# Patient Record
Sex: Male | Born: 1990 | Race: White | Hispanic: No | Marital: Single | Smoking: Current every day smoker
Health system: Southern US, Community
[De-identification: ages and names within clinical notes are randomized; demographics above are authoritative.]

## PROBLEM LIST (undated history)

## (undated) DIAGNOSIS — Z8739 Personal history of other diseases of the musculoskeletal system and connective tissue: Secondary | ICD-10-CM

## (undated) DIAGNOSIS — Z8709 Personal history of other diseases of the respiratory system: Secondary | ICD-10-CM

## (undated) HISTORY — DX: Personal history of other diseases of the musculoskeletal system and connective tissue: Z87.39

## (undated) HISTORY — DX: Personal history of other diseases of the respiratory system: Z87.09

---
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
## 2012-07-02 ENCOUNTER — Ambulatory Visit (INDEPENDENT_AMBULATORY_CARE_PROVIDER_SITE_OTHER): Payer: BC Managed Care – PPO | Admitting: Family Medicine

## 2012-07-02 ENCOUNTER — Encounter: Payer: Self-pay | Admitting: *Deleted

## 2012-07-02 VITALS — BP 118/82 | HR 64 | Temp 98.5°F | Ht 70.0 in | Wt 182.0 lb

## 2012-07-02 DIAGNOSIS — M549 Dorsalgia, unspecified: Secondary | ICD-10-CM

## 2012-07-02 DIAGNOSIS — M4180 Other forms of scoliosis, site unspecified: Secondary | ICD-10-CM

## 2012-07-02 MED ORDER — IBUPROFEN 800 MG PO TABS: 800.0000 mg | ORAL_TABLET | Freq: Three times a day (TID) | ORAL | Status: DC | PRN

## 2012-07-02 MED ORDER — TRAMADOL HCL 50 MG PO TABS: 100.0000 mg | ORAL_TABLET | Freq: Three times a day (TID) | ORAL | Status: DC | PRN

## 2012-07-02 MED ORDER — ALBUTEROL SULFATE HFA 108 (90 BASE) MCG/ACT IN AERS: 2.0000 | INHALATION_SPRAY | RESPIRATORY_TRACT | Status: DC | PRN

## 2012-07-02 NOTE — Progress Notes (Signed)
Subjective:    Patient ID: Derrick Roberts, male    DOB: 09-27-1990, 21 y.o.   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   Is miserable with back pain  Surgery was about 5 years ago  Was ok for a while - and then pain has gradually increased  Most of his pain is in lower back where fusion ends  Radiates into his R leg more than his L  Feet swell also - at the end of the day  Some numbness - in both feet -more so on the R side  Also tingling  No motor loss  No issues with bowel or bladder control   Does have deteriorating disc problems as well   Goes to [facility] for his care    Is trying to get through some more school - 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 [place]- with [facility] [employer] - he px opiates and he wants to avoid this however   Tramadol does control pain - somewhat   Patient Active Problem List  Diagnosis  . ADD  . ASTHMA  . ECZEMA  . SCOLIOSIS NEC  . PARESTHESIA   Past Medical History  Diagnosis Date  . History of allergic rhinitis   . History of asthma     mild  . History of kyphosis     Shueremann's Kyphosis   Past Surgical History  Procedure Laterality Date  . Elbow fracture surgery  05/1998  . Spinal fusion  2009    sx for shueremann's kyphosis   History  Substance Use Topics  . Smoking status: Current Every Day Smoker -- 0.25 packs/day    Types: Cigarettes  . Smokeless tobacco: Not on file  . Alcohol Use: No   No family history on file. Allergies  Allergen Reactions  . Aspirin   .  Methylphenidate Hcl     REACTION: diarrhea   No current outpatient prescriptions on file prior to visit.   No current facility-administered medications on file prior to visit.     Review of Systems Review of Systems  Constitutional: Negative for fever, appetite change, fatigue and unexpected weight change.  Eyes: Negative for pain and visual disturbance.  Respiratory: Negative for cough and shortness of breath.   Cardiovascular: Negative for cp or palpitations    Gastrointestinal: Negative for nausea, diarrhea and constipation.  Genitourinary: Negative for urgency and frequency.  Skin: Negative for pallor or rash   MSK pos for mod to severe back pain (thoracolumbar) , neg for swollen joints Neurological: Negative for weakness, light-headedness, and headaches.  Hematological: Negative for adenopathy. Does not bruise/bleed easily.  Psychiatric/Behavioral: Negative for dysphoric mood. The patient is not nervous/anxious.         Objective:   Physical Exam  Constitutional: He appears well-developed and well-nourished. No distress.  HENT:  Head: Normocephalic and atraumatic.  Mouth/Throat: Oropharynx is clear  and moist.  Eyes: Conjunctivae and EOM are normal. Pupils are equal, round, and reactive to light.  Neck: Normal range of motion. Neck supple. No thyromegaly present.  Cardiovascular: Normal rate and regular rhythm.   Pulmonary/Chest: Effort normal and breath sounds normal. No respiratory distress. He has no wheezes. He has no rales.  Musculoskeletal: He exhibits tenderness. He exhibits no edema.  Tender over spinous processes (over scar)- from lower thoracic to lower lumbar area Some mild para lumbar muscle tenderness without significant spasm Nl rom hips bilat SLR pos for lateral lower leg pain  Gait is labored only when first getting going  Poor rom spine due to prior fusion   Lymphadenopathy:    He has no cervical adenopathy.  Neurological: He is alert. He has normal  strength and normal reflexes. He displays no atrophy and no tremor. No sensory deficit. He exhibits normal muscle tone. Coordination normal.  Skin: Skin is warm and dry. No rash noted. No erythema. No pallor.  Psychiatric: He has a normal mood and affect.          Assessment & Plan:

## 2012-07-02 NOTE — Assessment & Plan Note (Signed)
Pt has hx of severe kyphosis with a fusion surgery done at [facility] in past - and hx of disc dz in addition to that - per pt  He is due to have 2 more surgeries to extend the fusion - but he wants very much to finish his last year of school before that - and frankly has much fear that the surgeries will make him worse instead of better  Is faced with chronic pain - and has been to local orthopedics and PT with no imp  Will refer him back to his surgeon at [facility] for further eval and disc of opt Given tramadol - max 6 per day and adv no more that 800 ibuprofen tid as well  Will use heat- stretch as tolerated as well  Exam reassuring neurologically today - though he does have symptoms of radiculopathy

## 2012-07-02 NOTE — Patient Instructions (Addendum)
See Shirlee Limerick on the way out about your referral  For pain - use tramadol 100 mg up to three times per day and maximum ibuprofen 800 mg three times per day with food  Use heat for muscle spasm if you need it

## 2012-08-26 ENCOUNTER — Emergency Department (HOSPITAL_COMMUNITY)
Admission: EM | Admit: 2012-08-26 | Discharge: 2012-08-26 | Disposition: A | Discharge status: Home / Self Care | Payer: BC Managed Care – PPO | Attending: Emergency Medicine | Admitting: Emergency Medicine

## 2012-08-26 ENCOUNTER — Encounter (HOSPITAL_COMMUNITY): Payer: Self-pay | Admitting: Emergency Medicine

## 2012-08-26 DIAGNOSIS — R070 Pain in throat: Secondary | ICD-10-CM | POA: Insufficient documentation

## 2012-08-26 DIAGNOSIS — Z8739 Personal history of other diseases of the musculoskeletal system and connective tissue: Secondary | ICD-10-CM | POA: Insufficient documentation

## 2012-08-26 DIAGNOSIS — F17200 Nicotine dependence, unspecified, uncomplicated: Secondary | ICD-10-CM | POA: Insufficient documentation

## 2012-08-26 DIAGNOSIS — K029 Dental caries, unspecified: Secondary | ICD-10-CM

## 2012-08-26 DIAGNOSIS — J45909 Unspecified asthma, uncomplicated: Secondary | ICD-10-CM | POA: Insufficient documentation

## 2012-08-26 MED ORDER — PENICILLIN V POTASSIUM 500 MG PO TABS
500.0000 mg | ORAL_TABLET | Freq: Once | ORAL | Status: AC
  Administered 2012-08-26: 500 mg via ORAL
  Filled 2012-08-26: qty 1

## 2012-08-26 MED ORDER — HYDROCODONE-ACETAMINOPHEN 5-325 MG PO TABS: 1.0000 | ORAL_TABLET | ORAL | Status: DC | PRN

## 2012-08-26 MED ORDER — PENICILLIN V POTASSIUM 500 MG PO TABS: 500.0000 mg | ORAL_TABLET | Freq: Four times a day (QID) | ORAL | Status: DC

## 2012-08-26 MED ORDER — HYDROCODONE-ACETAMINOPHEN 5-325 MG PO TABS
1.0000 | ORAL_TABLET | Freq: Once | ORAL | Status: AC
  Administered 2012-08-26: 1 via ORAL
  Filled 2012-08-26: qty 1

## 2012-08-26 NOTE — Discharge Instructions (Signed)
Dental Caries Dental caries (cavities) are areas of tooth decay. Cavities are usually caused by a combination of poor dental care; sugar; tobacco, alcohol, and drug abuse; decreased saliva production; and receding gums. If cavities are not treated by a dentist, they grow in size. This can cause toothaches, infection, and loss of the tooth. Cavities of the outer tooth enamel do not cause symptoms. Dental pain from cold drinks may be the first sign the enamel has broken down and decay has spread toward the root of the tooth. This can cause the tooth to die or become infected. If a cavity is treated before it causes toothache, the tooth can usually be saved. Cavities can be prevented by good oral hygiene. Brushing your teeth in the morning and before bed, and using dental floss once daily helps remove plaque and reduce bacteria. Candy, soft drinks, and other sources of sugar promote tooth decay by promoting the growth of bacteria in the mouth. Proper diet, fluoride, dental cleaning, and fillings are important in preventing the loss of teeth from decay. Antibiotics, root canal treatment, or dental extraction may be needed if the decay is severe. Take any pain medication or antibiotics as directed by your caregiver. It is important that you follow up with a dentist for definitive care. SEEK MEDICAL CARE IF:   You or your child has an oral temperature above 102 F (38.9 C).  There is difficulty opening the mouth.  There is difficulty swallowing or handling secretions.  There is difficulty breathing.  There is chest pain.  There are worsening or concerning symptoms. Document Released: 05/05/2004 Document Revised: 06/20/2011 Document Reviewed: 07/21/2009 [facility] Patient Information 2013 [place], [place].

## 2012-08-26 NOTE — ED Notes (Signed)
Pt presents w/ dental pain, right lower that has progressed to causing some difficulty swallowing and neck soreness. Pt endorses being on antibx prescribed by [facility] and taking all as prescribed. Pt instructed in the importance of taking all of antibx as prescribed to ensure that infections are completely cleared up.

## 2012-08-26 NOTE — ED Provider Notes (Signed)
Medical screening examination/treatment/procedure(s) were performed by non-physician practitioner and as supervising physician I was immediately available for consultation/collaboration.  Doug Sou, MD 08/26/12 2106

## 2012-08-26 NOTE — ED Provider Notes (Signed)
History    This chart was scribed for non-physician practitioner Elpidio Anis PA-C working with Doug Sou, MD by Smitty Pluck, ED scribe. This patient was seen in room WTR9/WTR9 and the patient's care was started at 4:17 PM.   CSN: [id number]  Arrival date & time 08/26/12  1525       Chief Complaint  Patient presents with  . Dental Pain  . Sore Throat     The history is provided by the patient and medical records. No language interpreter was used.   HPI Comments: Derrick Roberts is a 22 y.o. male who presents to the Emergency Department complaining of intermittent, moderate lower right dental pain that has been ongoing for the 2-3 of months but worsening 1 day ago. Pt reports pain radiates to neck. He reports that he has used Oragel without relief. Pt denies fever, chills, nausea, vomiting, diarrhea, weakness, cough, SOB and any other pain.  Pt does not have dentist.    Past Medical History  Diagnosis Date  . History of allergic rhinitis   . History of asthma     mild  . History of kyphosis     Shueremann's Kyphosis    Past Surgical History  Procedure Laterality Date  . Elbow fracture surgery  05/1998  . Spinal fusion  2009    sx for shueremann's kyphosis    No family history on file.  History  Substance Use Topics  . Smoking status: Current Every Day Smoker -- 0.25 packs/day    Types: Cigarettes  . Smokeless tobacco: Former Neurosurgeon    Quit date: 08/26/2008  . Alcohol Use: 1.8 oz/week    3 Cans of beer per week     Comment: weekly couple beers      Review of Systems  Constitutional: Negative for fever and chills.  HENT: Positive for dental problem.   Respiratory: Negative for cough and shortness of breath.   Gastrointestinal: Negative for nausea, vomiting and diarrhea.    Allergies  Aspirin and Methylphenidate hcl  Home Medications   Current Outpatient Rx  Name  Route  Sig  Dispense  Refill  . acetaminophen (TYLENOL) 500 MG tablet   Oral  Take 500 mg by mouth every 6 (six) hours as needed for pain.         Marland Kitchen albuterol (PROVENTIL HFA;VENTOLIN HFA) 108 (90 BASE) MCG/ACT inhaler   Inhalation   Inhale 2 puffs into the lungs every 4 (four) hours as needed for wheezing.   1 Inhaler   3   . ibuprofen (ADVIL,MOTRIN) 800 MG tablet   Oral   Take 1 tablet (800 mg total) by mouth every 8 (eight) hours as needed for pain (with meals).   90 tablet   3   . traMADol (ULTRAM) 50 MG tablet   Oral   Take 2 tablets (100 mg total) by mouth every 8 (eight) hours as needed for pain.   180 tablet   3     BP 147/87  Pulse 61  Temp(Src) 98.1 F (36.7 C) (Oral)  Resp 16  SpO2 100%  Physical Exam  Nursing note and vitals reviewed. Constitutional: He is oriented to person, place, and time. He appears well-developed and well-nourished. No distress.  HENT:  Head: Normocephalic and atraumatic.  Wide spread dental decay Tooth 29 has marked decay with surrounding swelling No pointing abscess   Eyes: EOM are normal.  Neck: Neck supple. No tracheal deviation present.  Pulmonary/Chest: Effort normal. No respiratory distress.  Musculoskeletal:  Normal range of motion.  Lymphadenopathy:    He has no cervical adenopathy.  Neurological: He is alert and oriented to person, place, and time.  Skin: Skin is warm and dry.  Psychiatric: He has a normal mood and affect. His behavior is normal.    ED Course  Procedures (including critical care time) DIAGNOSTIC STUDIES: Oxygen Saturation is 100% on room air, normal by my interpretation.    COORDINATION OF CARE: 4:19 PM Discussed ED treatment with pt and pt agrees.     Labs Reviewed - No data to display No results found.   No diagnosis found.  1. Dental caries   MDM  Widespread dental caries with #29 with marked decay. Abx and pain control.      I personally performed the services described in this documentation, which was scribed in my presence. The recorded information has  been reviewed and is accurate.    Arnoldo Hooker, PA-C 08/26/12 1627

## 2012-08-26 NOTE — ED Notes (Signed)
Discharge instructions reviewed w/ pt., and mother, both verbalize understanding. Two prescriptions provided at discharge.

## 2012-10-16 ENCOUNTER — Other Ambulatory Visit: Payer: Self-pay

## 2012-10-16 NOTE — Telephone Encounter (Signed)
Pt left v/m requesting refill Tramadol.Please advise.pt request cb.

## 2012-10-16 NOTE — Telephone Encounter (Signed)
Please check in with him re: his back pain and see how he is doing and find out if he went back to [facility] for a visit - I cannot seem to get any info from his care everywhere tab Refill med for 3 mo please

## 2012-10-17 MED ORDER — TRAMADOL HCL 50 MG PO TABS: 100.0000 mg | ORAL_TABLET | Freq: Three times a day (TID) | ORAL | Status: DC | PRN

## 2012-10-17 NOTE — Telephone Encounter (Signed)
Med refilled, and called pt but no answer and voicemail is full, will try to call back tomorrow

## 2012-12-11 ENCOUNTER — Other Ambulatory Visit: Payer: Self-pay | Admitting: *Deleted

## 2012-12-11 MED ORDER — TRAMADOL HCL 50 MG PO TABS: 100.0000 mg | ORAL_TABLET | Freq: Three times a day (TID) | ORAL | Status: DC | PRN

## 2012-12-11 NOTE — Telephone Encounter (Signed)
Pt calls requesting refill of Tramadol.

## 2012-12-11 NOTE — Telephone Encounter (Signed)
Rx called in as prescribed, and pt notified  

## 2012-12-11 NOTE — Telephone Encounter (Signed)
Spoke with pt and last time he picked up Rx was 11/16/12 and he was advised by pharmacy no refills remain.   Spoke with pharmacy and pt last picked up Rx on 11/16/12, pt did have 1 refill remaining on file with them but it was voided out because it was prescribed before tramadol became a controlled substance so pharmacy would need a new Rx sent in, please advise

## 2012-12-11 NOTE — Telephone Encounter (Signed)
Thank you - that clarifies things Px written for call in

## 2012-12-11 NOTE — Telephone Encounter (Signed)
It looks like he is a bit early for this refill - please clarify with him/ family- thanks

## 2012-12-28 ENCOUNTER — Telehealth: Payer: Self-pay

## 2012-12-28 MED ORDER — AMPHETAMINE-DEXTROAMPHETAMINE 10 MG PO TABS: ORAL_TABLET | ORAL | Status: DC

## 2012-12-28 NOTE — Telephone Encounter (Signed)
I wrote for his old dosage - 1 in am, 1 lunch and 1/2 for homework as needed  Let me know if there are any problems In IN box for pick up

## 2012-12-28 NOTE — Telephone Encounter (Signed)
Pt has started back to school in [place] and request refill adderall 10 mg. Pt has not taken since high school; (on historical med list but last filled 2012). Pt request cb.

## 2012-12-31 NOTE — Telephone Encounter (Signed)
Pt notified Rx ready for pick-up and advise to let us know if there is any problems with his old dosage

## 2012-12-31 NOTE — Telephone Encounter (Signed)
Called pt and no answer and voicemail box was full, will try to call back later

## 2013-01-01 ENCOUNTER — Encounter: Payer: Self-pay | Admitting: Family Medicine

## 2013-01-15 ENCOUNTER — Encounter: Payer: Self-pay | Admitting: Family Medicine

## 2013-02-27 ENCOUNTER — Other Ambulatory Visit: Payer: Self-pay | Admitting: Family Medicine

## 2013-02-27 NOTE — Telephone Encounter (Signed)
refill not due until 03/12/13, Rx declined

## 2013-02-27 NOTE — Telephone Encounter (Signed)
Electronic refill request, please advise  

## 2013-02-27 NOTE — Telephone Encounter (Signed)
Is he due yet? -given 180 with 2 refills two mo ago -- am I calculating incorrectly?

## 2013-03-01 ENCOUNTER — Other Ambulatory Visit: Payer: Self-pay | Admitting: Family Medicine

## 2013-03-04 ENCOUNTER — Other Ambulatory Visit: Payer: Self-pay | Admitting: Family Medicine

## 2013-03-05 NOTE — Telephone Encounter (Signed)
Px written for call in  Please check in with pt to see how he is doing with pain - I am curious to know if he has been back to [facility] for a re check and would like to send for their last note if so  thanks

## 2013-03-05 NOTE — Telephone Encounter (Signed)
Pt left v/m requesting status of Tramadol rx to [facility] [address] spoke with Crystal at [address] and pt got refills of Tramadol on 12/11/12, 01/05/13 and 01/30/13.Please advise. Pt request cb when refilled.

## 2013-03-06 ENCOUNTER — Other Ambulatory Visit: Payer: Self-pay | Admitting: Family Medicine

## 2013-03-06 NOTE — Telephone Encounter (Signed)
Rx called in as prescribed, called pt and no answer and voicemail full

## 2013-03-29 ENCOUNTER — Ambulatory Visit (INDEPENDENT_AMBULATORY_CARE_PROVIDER_SITE_OTHER): Payer: BC Managed Care – PPO | Admitting: Family Medicine

## 2013-03-29 ENCOUNTER — Encounter: Payer: Self-pay | Admitting: Family Medicine

## 2013-03-29 ENCOUNTER — Encounter: Payer: Self-pay | Admitting: *Deleted

## 2013-03-29 ENCOUNTER — Encounter: Payer: Self-pay | Admitting: Radiology

## 2013-03-29 VITALS — BP 136/84 | HR 74 | Temp 98.4°F | Ht 70.0 in | Wt 200.5 lb

## 2013-03-29 DIAGNOSIS — M549 Dorsalgia, unspecified: Secondary | ICD-10-CM

## 2013-03-29 DIAGNOSIS — J069 Acute upper respiratory infection, unspecified: Secondary | ICD-10-CM

## 2013-03-29 DIAGNOSIS — M4180 Other forms of scoliosis, site unspecified: Secondary | ICD-10-CM

## 2013-03-29 MED ORDER — AMPHETAMINE-DEXTROAMPHETAMINE 10 MG PO TABS: ORAL_TABLET | ORAL | Status: DC

## 2013-03-29 MED ORDER — TRAMADOL HCL 50 MG PO TABS: ORAL_TABLET | ORAL | Status: DC

## 2013-03-29 NOTE — Progress Notes (Signed)
Pre-visit discussion using our clinic review tool. No additional management support is needed unless otherwise documented below in the visit note.  

## 2013-03-29 NOTE — Patient Instructions (Signed)
I refilled tramadol- do not take more than 2 pills every 8 hours  Take care of yourself  Get back to your surgeon at [facility]  We will do a pain clinic referral at check out  For cold symptoms - I recommend nasal saline spray and mucinex DM  for congestion and cough  and for fever - tylenol and humidifier   Update if not starting to improve in a week or if worsening

## 2013-03-29 NOTE — Progress Notes (Signed)
Subjective:    Patient ID: Derrick Roberts, male    DOB: Mar 08, 1991, 22 y.o.   MRN: [id number]  HPI Here with uri symptoms  Feels something in throat and coughing a lot  Feels like he is coming down with a cold  No otc meds  Some stuffy and runny nose  Feels feverish am and pm  Has had cold symptoms for about a week    Works at [employer]  His hours have increased  Also going to school in evenings now  Tramadol works for him - difficult to take 6 per day   (he is taking about 8 per day)- is not holding him pain wise   He went back to meet with surgeon - at [facility]  Has 2 surgeries left to go   In terms of school - is a few months in and has 1-2 years (plans to finish up on line)  Patient Active Problem List   Diagnosis Date Noted  . Viral URI 03/29/2013  . Back pain 07/02/2012  . PARESTHESIA 06/15/2010  . ADD 10/20/2006  . SCOLIOSIS NEC 10/20/2006  . ASTHMA 10/19/2006  . ECZEMA 10/19/2006   Past Medical History  Diagnosis Date  . History of allergic rhinitis   . History of asthma     mild  . History of kyphosis     Shueremann's Kyphosis   Past Surgical History  Procedure Laterality Date  . Elbow fracture surgery  05/1998  . Spinal fusion  2009    sx for shueremann's kyphosis   History  Substance Use Topics  . Smoking status: Current Every Day Smoker -- 0.25 packs/day    Types: Cigarettes  . Smokeless tobacco: Former Neurosurgeon    Quit date: 08/26/2008  . Alcohol Use: 1.8 oz/week    3 Cans of beer per week     Comment: weekly couple beers   No family history on file. Allergies  Allergen Reactions  . Aspirin Nausea Only and Other (See Comments)    Stomach problems  . Methylphenidate Hcl     REACTION: diarrhea   Current Outpatient Prescriptions on File Prior to Visit  Medication Sig Dispense Refill  . acetaminophen (TYLENOL) 500 MG tablet Take 500 mg by mouth every 6 (six) hours as needed for pain.      Marland Kitchen albuterol (PROVENTIL HFA;VENTOLIN HFA) 108 (90 BASE)  MCG/ACT inhaler Inhale 2 puffs into the lungs every 4 (four) hours as needed for wheezing.  1 Inhaler  3  . HYDROcodone-acetaminophen (NORCO/VICODIN) 5-325 MG per tablet Take 1 tablet by mouth every 4 (four) hours as needed for pain.  15 tablet  0  . ibuprofen (ADVIL,MOTRIN) 800 MG tablet Take 1 tablet (800 mg total) by mouth every 8 (eight) hours as needed for pain (with meals).  90 tablet  3   No current facility-administered medications on file prior to visit.    Review of Systems Review of Systems  Constitutional: Negative for fever, appetite change, and unexpected weight change. pos for fatigue  ENT pos for cong and rhinorrhea/ neg for sinus pain  Eyes: Negative for pain and visual disturbance.  Respiratory: Negative for wheeze  and shortness of breath.   Cardiovascular: Negative for cp or palpitations    Gastrointestinal: Negative for nausea, diarrhea and constipation.  Genitourinary: Negative for urgency and frequency.  Skin: Negative for pallor or rash  MSK pos for chronic back pain   Neurological: Negative for weakness, light-headedness, numbness and headaches.  Hematological: Negative for  adenopathy. Does not bruise/bleed easily.  Psychiatric/Behavioral: Negative for dysphoric mood. The patient is not nervous/anxious.         Objective:   Physical Exam  Constitutional: He appears well-developed and well-nourished. No distress.  overwt and well app  HENT:  Head: Normocephalic and atraumatic.  Right Ear: External ear normal.  Left Ear: External ear normal.  Mouth/Throat: Oropharynx is clear and moist. No oropharyngeal exudate.  Nares are injected and congested  No sinus tenderness Clear post nasal drip and rhinorrhea   Eyes: Conjunctivae and EOM are normal. Pupils are equal, round, and reactive to light. Right eye exhibits no discharge. Left eye exhibits no discharge.  Neck: Normal range of motion. Neck supple.  Cardiovascular: Normal rate, regular rhythm and intact  distal pulses.   Pulmonary/Chest: Effort normal and breath sounds normal. No respiratory distress. He has no wheezes. He has no rales.  Musculoskeletal: He exhibits tenderness. He exhibits no edema.  Tender over middle and lower spine Incision well healed spanning entire spine  Some muscle spasm noted   Lymphadenopathy:    He has no cervical adenopathy.  Neurological: He is alert. He has normal reflexes. Coordination normal.  Skin: Skin is warm and dry. No rash noted. No erythema. No pallor.  Psychiatric: He has a normal mood and affect.          Assessment & Plan:

## 2013-03-29 NOTE — Telephone Encounter (Signed)
Addressed at his appt today

## 2013-03-31 NOTE — Assessment & Plan Note (Signed)
Disc symptomatic care - mucinex/ nasal saline irrigation / otc antihistamine prn and fluids/ rest  Update if not starting to improve in a week or if worsening

## 2013-03-31 NOTE — Assessment & Plan Note (Signed)
S/p surgery for severe kyphosis-per pt in need of 2 more f/u surgeries He was going to wait until finishing school but at this point is unable to continue school and work from a chronic pain perspective  Has been taking too much tramadol- disc dosing parameters for this  Refilled it - dosing 2 pills tid maximum- disc the long acting form- he declined at this time  Will ref to pain clinic He also plans to follow up with surgeon and poss put work and school on hold in the future

## 2013-04-15 ENCOUNTER — Telehealth: Payer: Self-pay | Admitting: Family Medicine

## 2013-04-15 NOTE — Telephone Encounter (Signed)
rec assured toxicology report - results do not show tramadol or amphetamine (which he is supposed to be taking)- but is pos for a-hydroxyalprazolam, hydrocodone, hydromorphone, oxymorphone, TH and Etoh  Unable to reach pt - his message box is full  I will no longer px any meds until we can get in contact with him

## 2013-04-22 ENCOUNTER — Telehealth: Payer: Self-pay | Admitting: Family Medicine

## 2013-04-22 NOTE — Telephone Encounter (Signed)
Have called pt multiple times re: his assured toxicology and voicemail box is full

## 2013-04-29 ENCOUNTER — Other Ambulatory Visit: Payer: Self-pay | Admitting: Family Medicine

## 2013-04-29 NOTE — Telephone Encounter (Signed)
Pt left v/m requesting cb at [phone]. about tramadol refill.

## 2013-04-29 NOTE — Telephone Encounter (Signed)
Electronic refill request, please advise  FYI: assured toxicology results on your desk

## 2013-04-29 NOTE — Telephone Encounter (Signed)
Rx declined and I advise the pharmacy that pt needs to call office 1st

## 2013-04-29 NOTE — Telephone Encounter (Signed)
I have been trying to reach him about the toxicology results - but every time I call his "mailbox" is full and it does not allow me to leave a message I cannot refill his tramadol due to this  He will need to follow up if he wants to speak about it  thanks

## 2013-04-30 NOTE — Telephone Encounter (Signed)
Let him know please that he needs to come in re: his tox result - I have been unable to reach him

## 2013-04-30 NOTE — Telephone Encounter (Signed)
Called pt but no answer and voicemail box is full

## 2013-05-02 ENCOUNTER — Encounter: Payer: Self-pay | Admitting: Radiology

## 2013-05-02 NOTE — Telephone Encounter (Signed)
Pt has appt with Dr. Milinda Antisower tomorrow 05/03/13

## 2013-05-03 ENCOUNTER — Encounter: Payer: Self-pay | Admitting: Family Medicine

## 2013-05-03 ENCOUNTER — Ambulatory Visit (INDEPENDENT_AMBULATORY_CARE_PROVIDER_SITE_OTHER): Payer: BC Managed Care – PPO | Admitting: Family Medicine

## 2013-05-03 VITALS — BP 118/78 | HR 74 | Temp 98.5°F | Ht 70.0 in | Wt 201.2 lb

## 2013-05-03 DIAGNOSIS — M549 Dorsalgia, unspecified: Secondary | ICD-10-CM

## 2013-05-03 DIAGNOSIS — Z79899 Other long term (current) drug therapy: Secondary | ICD-10-CM

## 2013-05-03 MED ORDER — TRAMADOL HCL 50 MG PO TABS: ORAL_TABLET | ORAL | Status: DC

## 2013-05-03 MED ORDER — ALBUTEROL SULFATE HFA 108 (90 BASE) MCG/ACT IN AERS: 2.0000 | INHALATION_SPRAY | RESPIRATORY_TRACT | Status: DC | PRN

## 2013-05-03 MED ORDER — AMPHETAMINE-DEXTROAMPHETAMINE 10 MG PO TABS: ORAL_TABLET | ORAL | Status: DC

## 2013-05-03 NOTE — Progress Notes (Signed)
Pre-visit discussion using our clinic review tool. No additional management support is needed unless otherwise documented below in the visit note.  

## 2013-05-03 NOTE — Patient Instructions (Signed)
See your surgeon at [facility]  Px today for 60 adderall (that should last a month)  For 180 tramadol (that should last a month or more)  Please abstain completely from narcotics, anxiety medicine like xanax, marijuana , and alcohol   Have your surgeon send me a note so I have an update

## 2013-05-03 NOTE — Progress Notes (Signed)
   Subjective:    Patient ID: Derrick Roberts, male    DOB: Oct 15, 1990, 23 y.o.   MRN: [id number]  HPI Last tox screen was abn-   Takes adderall 12/19  Takes it every other day - some days he leaves it at home / since it does affect appetite- does not take it unless he abs has to   Tramadol -- he had been out of it for 2 d and result was neg   Work has slowed down a lot  Has not needed it as much- he takes 2 pills two to three times per day   He has appt to see his surgeon - Feb 18-19th  Did not end up going to pain clinic yet - wants to see what sure what he Has to say   tox screen was pos for xanax metabolite - says he took one at beginning of dec , and 1 mg xanax -- from his mom's friend   Had taken narcotic - the 2nd week of dec - had 4 hydrocodone 5-325 - because he had run out of tramadol   THC - had taken one hit - on the 15th - uses it very rarely  He had drank alcohol the night before his test -- was drinking to get himself to sleep  A 6 pack  This is not unusual  He declines that he has a problem with alcohol- just occ luses it for sleep   As of today- his last tramadol dose was 5 days ago - because his pain has not been as bad  Last adderall dose was 3 days ago   No xanax-= over a month  Last THC was at the end of December - will stop that  Drank 4 beers last night - thinks he can stop that use  Lab today for tox    Review of Systems Review of Systems  Constitutional: Negative for fever, appetite change, fatigue and unexpected weight change.  Eyes: Negative for pain and visual disturbance.  Respiratory: Negative for cough and shortness of breath.   Cardiovascular: Negative for cp or palpitations    Gastrointestinal: Negative for nausea, diarrhea and constipation.  Genitourinary: Negative for urgency and frequency.  Skin: Negative for pallor or rash   MSK pos for low back pain  Neurological: Negative for weakness, light-headedness, numbness and headaches.    Hematological: Negative for adenopathy. Does not bruise/bleed easily.  Psychiatric/Behavioral: Negative for dysphoric mood. The patient is not nervous/anxious.         Objective:   Physical Exam  Constitutional: He appears well-developed and well-nourished. No distress.  Musculoskeletal: He exhibits no edema.  Neurological: He is alert. He has normal reflexes. He displays no tremor. No cranial nerve deficit. He exhibits normal muscle tone. Coordination normal.  Skin: Skin is warm and dry. No rash noted. No erythema.  Psychiatric: He has a normal mood and affect.  Somewhat blunted affect           Assessment & Plan:

## 2013-05-05 DIAGNOSIS — Z79899 Other long term (current) drug therapy: Secondary | ICD-10-CM | POA: Insufficient documentation

## 2013-05-05 NOTE — Assessment & Plan Note (Addendum)
Pt [facility] app[facility] in Feb- will make long term plan  Does need surgery  He will hold off on pain clinic ref until then See controlled subst agreement - outlined guidelines/ must not have any other drugs in system besides tramadol/ adderal in the future

## 2013-05-05 NOTE — Assessment & Plan Note (Signed)
Reviewed pt's last assured tox report  He was using xanax (from someone else) for sleep long with alcohol Also had hycrocodone- also from someone else for pain- was using because he was out of tramadol at the time   Long disc re: current med use and danger of mixing meds  I will no longer continue to refill tramadol if he uses alcohol, marijuana, narcotics or benzodiazepines  Pt voiced understanding  Repeat tox report today  No THC since dec/ uses adderall NOT daily / tramadol less often and no narcotics  No xanax at all  Did drink alcohol last night- denies dependence  Rev in detail today

## 2013-05-06 ENCOUNTER — Telehealth: Payer: Self-pay | Admitting: Family Medicine

## 2013-05-06 NOTE — Telephone Encounter (Signed)
Relevant patient education assigned to patient using Emmi. ° °

## 2013-05-20 ENCOUNTER — Encounter: Payer: Self-pay | Admitting: Family Medicine

## 2013-05-24 ENCOUNTER — Encounter: Payer: Self-pay | Admitting: Family Medicine

## 2014-04-22 ENCOUNTER — Encounter (HOSPITAL_COMMUNITY): Payer: Self-pay

## 2014-04-22 ENCOUNTER — Emergency Department (HOSPITAL_COMMUNITY): Payer: BLUE CROSS/BLUE SHIELD

## 2014-04-22 ENCOUNTER — Emergency Department (HOSPITAL_COMMUNITY)
Admission: EM | Admit: 2014-04-22 | Discharge: 2014-04-22 | Disposition: A | Discharge status: Home / Self Care | Payer: BLUE CROSS/BLUE SHIELD | Attending: Emergency Medicine | Admitting: Emergency Medicine

## 2014-04-22 DIAGNOSIS — R1084 Generalized abdominal pain: Secondary | ICD-10-CM

## 2014-04-22 DIAGNOSIS — K529 Noninfective gastroenteritis and colitis, unspecified: Secondary | ICD-10-CM | POA: Diagnosis not present

## 2014-04-22 DIAGNOSIS — R109 Unspecified abdominal pain: Secondary | ICD-10-CM

## 2014-04-22 DIAGNOSIS — R001 Bradycardia, unspecified: Secondary | ICD-10-CM | POA: Diagnosis not present

## 2014-04-22 DIAGNOSIS — J45909 Unspecified asthma, uncomplicated: Secondary | ICD-10-CM | POA: Diagnosis not present

## 2014-04-22 DIAGNOSIS — Z8739 Personal history of other diseases of the musculoskeletal system and connective tissue: Secondary | ICD-10-CM | POA: Diagnosis not present

## 2014-04-22 DIAGNOSIS — Z720 Tobacco use: Secondary | ICD-10-CM | POA: Insufficient documentation

## 2014-04-22 DIAGNOSIS — Z79899 Other long term (current) drug therapy: Secondary | ICD-10-CM | POA: Insufficient documentation

## 2014-04-22 LAB — URINALYSIS, ROUTINE W REFLEX MICROSCOPIC
Bilirubin Urine: NEGATIVE
GLUCOSE, UA: NEGATIVE mg/dL
Hgb urine dipstick: NEGATIVE
KETONES UR: 40 mg/dL — AB
LEUKOCYTES UA: NEGATIVE
Nitrite: NEGATIVE
PH: 6 (ref 5.0–8.0)
Protein, ur: NEGATIVE mg/dL
Specific Gravity, Urine: 1.03 (ref 1.005–1.030)
Urobilinogen, UA: 0.2 mg/dL (ref 0.0–1.0)

## 2014-04-22 LAB — CBC WITH DIFFERENTIAL/PLATELET
BASOS PCT: 0 % (ref 0–1)
Basophils Absolute: 0 10*3/uL (ref 0.0–0.1)
EOS ABS: 0.1 10*3/uL (ref 0.0–0.7)
EOS PCT: 0 % (ref 0–5)
HCT: 47.1 % (ref 39.0–52.0)
HEMOGLOBIN: 16.3 g/dL (ref 13.0–17.0)
LYMPHS ABS: 1.7 10*3/uL (ref 0.7–4.0)
Lymphocytes Relative: 11 % — ABNORMAL LOW (ref 12–46)
MCH: 30.4 pg (ref 26.0–34.0)
MCHC: 34.6 g/dL (ref 30.0–36.0)
MCV: 87.7 fL (ref 78.0–100.0)
MONOS PCT: 4 % (ref 3–12)
Monocytes Absolute: 0.7 10*3/uL (ref 0.1–1.0)
NEUTROS PCT: 85 % — AB (ref 43–77)
Neutro Abs: 13.1 10*3/uL — ABNORMAL HIGH (ref 1.7–7.7)
Platelets: 299 10*3/uL (ref 150–400)
RBC: 5.37 MIL/uL (ref 4.22–5.81)
RDW: 12.4 % (ref 11.5–15.5)
WBC: 15.6 10*3/uL — ABNORMAL HIGH (ref 4.0–10.5)

## 2014-04-22 LAB — HEPATIC FUNCTION PANEL
ALT: 24 U/L (ref 0–53)
AST: 22 U/L (ref 0–37)
Albumin: 4.9 g/dL (ref 3.5–5.2)
Alkaline Phosphatase: 63 U/L (ref 39–117)
BILIRUBIN TOTAL: 0.8 mg/dL (ref 0.3–1.2)
Total Protein: 8.3 g/dL (ref 6.0–8.3)

## 2014-04-22 LAB — I-STAT CHEM 8, ED
BUN: 18 mg/dL (ref 6–23)
CREATININE: 0.9 mg/dL (ref 0.50–1.35)
Calcium, Ion: 1.22 mmol/L (ref 1.12–1.23)
Chloride: 102 mEq/L (ref 96–112)
GLUCOSE: 132 mg/dL — AB (ref 70–99)
HEMATOCRIT: 52 % (ref 39.0–52.0)
HEMOGLOBIN: 17.7 g/dL — AB (ref 13.0–17.0)
POTASSIUM: 4.2 mmol/L (ref 3.5–5.1)
Sodium: 141 mmol/L (ref 135–145)
TCO2: 24 mmol/L (ref 0–100)

## 2014-04-22 MED ORDER — HYDROCODONE-ACETAMINOPHEN 5-325 MG PO TABS: 1.0000 | ORAL_TABLET | Freq: Four times a day (QID) | ORAL | Status: DC | PRN

## 2014-04-22 MED ORDER — IOHEXOL 300 MG/ML  SOLN
50.0000 mL | Freq: Once | INTRAMUSCULAR | Status: AC | PRN
  Administered 2014-04-22: 50 mL via ORAL

## 2014-04-22 MED ORDER — SODIUM CHLORIDE 0.9 % IV BOLUS (SEPSIS)
1000.0000 mL | Freq: Once | INTRAVENOUS | Status: AC
  Administered 2014-04-22: 1000 mL via INTRAVENOUS

## 2014-04-22 MED ORDER — ONDANSETRON HCL 4 MG/2ML IJ SOLN
4.0000 mg | Freq: Once | INTRAMUSCULAR | Status: AC
  Administered 2014-04-22: 4 mg via INTRAVENOUS
  Filled 2014-04-22: qty 2

## 2014-04-22 MED ORDER — MORPHINE SULFATE 4 MG/ML IJ SOLN
4.0000 mg | Freq: Once | INTRAMUSCULAR | Status: AC
  Administered 2014-04-22: 4 mg via INTRAVENOUS
  Filled 2014-04-22: qty 1

## 2014-04-22 MED ORDER — IOHEXOL 300 MG/ML  SOLN
100.0000 mL | Freq: Once | INTRAMUSCULAR | Status: AC | PRN
  Administered 2014-04-22: 100 mL via INTRAVENOUS

## 2014-04-22 MED ORDER — ONDANSETRON HCL 4 MG PO TABS: 4.0000 mg | ORAL_TABLET | Freq: Four times a day (QID) | ORAL | Status: AC

## 2014-04-22 MED ORDER — HYDROMORPHONE HCL 1 MG/ML IJ SOLN
1.0000 mg | Freq: Once | INTRAMUSCULAR | Status: AC
  Administered 2014-04-22: 1 mg via INTRAVENOUS
  Filled 2014-04-22: qty 1

## 2014-04-22 NOTE — ED Notes (Signed)
Patient reports low abdominal pain that started suddenly at approximately 2300 last evening.  He reports vomiting x 6-7 times prior to arrival at hospital.

## 2014-04-22 NOTE — Discharge Instructions (Signed)
Abdominal Pain °Many things can cause abdominal pain. Usually, abdominal pain is not caused by a disease and will improve without treatment. It can often be observed and treated at home. Your health care provider will do a physical exam and possibly order blood tests and X-rays to help determine the seriousness of your pain. However, in many cases, more time must pass before a clear cause of the pain can be found. Before that point, your health care provider may not know if you need more testing or further treatment. °HOME CARE INSTRUCTIONS  °Monitor your abdominal pain for any changes. The following actions may help to alleviate any discomfort you are experiencing: °· Only take over-the-counter or prescription medicines as directed by your health care provider. °· Do not take laxatives unless directed to do so by your health care provider. °· Try a clear liquid diet (broth, tea, or water) as directed by your health care provider. Slowly move to a bland diet as tolerated. °SEEK MEDICAL CARE IF: °· You have unexplained abdominal pain. °· You have abdominal pain associated with nausea or diarrhea. °· You have pain when you urinate or have a bowel movement. °· You experience abdominal pain that wakes you in the night. °· You have abdominal pain that is worsened or improved by eating food. °· You have abdominal pain that is worsened with eating fatty foods. °· You have a fever. °SEEK IMMEDIATE MEDICAL CARE IF:  °· Your pain does not go away within 2 hours. °· You keep throwing up (vomiting). °· Your pain is felt only in portions of the abdomen, such as the right side or the left lower portion of the abdomen. °· You pass bloody or black tarry stools. °MAKE SURE YOU: °· Understand these instructions.   °· Will watch your condition.   °· Will get help right away if you are not doing well or get worse.   °Document Released: 01/05/2005 Document Revised: 04/02/2013 Document Reviewed: 12/05/2012 °[employer]® Patient Information  ©2015 [employer]. This information is not intended to replace advice given to you by your health care provider. Make sure you discuss any questions you have with your health care provider. ° °Nausea and Vomiting °Nausea is a sick feeling that often comes before throwing up (vomiting). Vomiting is a reflex where stomach contents come out of your mouth. Vomiting can cause severe loss of body fluids (dehydration). Children and elderly adults can become dehydrated quickly, especially if they also have diarrhea. Nausea and vomiting are symptoms of a condition or disease. It is important to find the cause of your symptoms. °CAUSES  °· Direct irritation of the stomach lining. This irritation can result from increased acid production (gastroesophageal reflux disease), infection, food poisoning, taking certain medicines (such as nonsteroidal anti-inflammatory drugs), alcohol use, or tobacco use. °· Signals from the brain. These signals could be caused by a headache, heat exposure, an inner ear disturbance, increased pressure in the brain from injury, infection, a tumor, or a concussion, pain, emotional stimulus, or metabolic problems. °· An obstruction in the gastrointestinal tract (bowel obstruction). °· Illnesses such as diabetes, hepatitis, gallbladder problems, appendicitis, kidney problems, cancer, sepsis, atypical symptoms of a heart attack, or eating disorders. °· Medical treatments such as chemotherapy and radiation. °· Receiving medicine that makes you sleep (general anesthetic) during surgery. °DIAGNOSIS °Your caregiver may ask for tests to be done if the problems do not improve after a few days. Tests may also be done if symptoms are severe or if the reason for the nausea   and vomiting is not clear. Tests may include: °· Urine tests. °· Blood tests. °· Stool tests. °· Cultures (to look for evidence of infection). °· X-rays or other imaging studies. °Test results can help your caregiver make decisions about  treatment or the need for additional tests. °TREATMENT °You need to stay well hydrated. Drink frequently but in small amounts. You may wish to drink water, sports drinks, clear broth, or eat frozen ice pops or gelatin dessert to help stay hydrated. When you eat, eating slowly may help prevent nausea. There are also some antinausea medicines that may help prevent nausea. °HOME CARE INSTRUCTIONS  °· Take all medicine as directed by your caregiver. °· If you do not have an appetite, do not force yourself to eat. However, you must continue to drink fluids. °· If you have an appetite, eat a normal diet unless your caregiver tells you differently. °¨ Eat a variety of complex carbohydrates (rice, wheat, potatoes, bread), lean meats, yogurt, fruits, and vegetables. °¨ Avoid high-fat foods because they are more difficult to digest. °· Drink enough water and fluids to keep your urine clear or pale yellow. °· If you are dehydrated, ask your caregiver for specific rehydration instructions. Signs of dehydration may include: °¨ Severe thirst. °¨ Dry lips and mouth. °¨ Dizziness. °¨ Dark urine. °¨ Decreasing urine frequency and amount. °¨ Confusion. °¨ Rapid breathing or pulse. °SEEK IMMEDIATE MEDICAL CARE IF:  °· You have blood or brown flecks (like coffee grounds) in your vomit. °· You have black or bloody stools. °· You have a severe headache or stiff neck. °· You are confused. °· You have severe abdominal pain. °· You have chest pain or trouble breathing. °· You do not urinate at least once every 8 hours. °· You develop cold or clammy skin. °· You continue to vomit for longer than 24 to 48 hours. °· You have a fever. °MAKE SURE YOU:  °· Understand these instructions. °· Will watch your condition. °· Will get help right away if you are not doing well or get worse. °Document Released: 03/28/2005 Document Revised: 06/20/2011 Document Reviewed: 08/25/2010 °[employer]® Patient Information ©2015 [employer]. This information is not  intended to replace advice given to you by your health care provider. Make sure you discuss any questions you have with your health care provider. ° °

## 2014-04-22 NOTE — ED Notes (Signed)
Patient inform that we were waiting on Urine sample.

## 2014-04-22 NOTE — ED Notes (Signed)
Patient was educated not to drive, operate heavy machinery, or drink alcohol while taking narcotic medication.  

## 2014-04-22 NOTE — ED Notes (Signed)
Patient states the pain started at 23:00 after eating waffles and ice cream.

## 2014-04-22 NOTE — ED Provider Notes (Signed)
CSN: [id number]     Arrival date & time 04/22/14  0551 History   First MD Initiated Contact with Patient 04/22/14 0615     Chief Complaint  Patient presents with  . Abdominal Pain     (Consider location/radiation/quality/duration/timing/severity/associated sxs/prior Treatment) HPI Comments: Patient with no pertinent past medical history presents to the emergency department with chief complaint of left-sided abdominal pain, nausea, and vomiting. He reports 6-7 episodes of nonbloody nonbilious vomiting last night. He complains of moderate to severe left-sided abdominal pain. Pain is mostly located over his stomach. He denies any right-sided abdominal tenderness. Denies any fevers, but states that he has had chills. Denies any chest pain, shortness of breath, or diarrhea. He also complains of a mild amount of dysuria, but denies any penile discharge. He denies any prior abdominal surgical history. States that prior to getting sick, he was eating waffles and ice cream.  The history is provided by the patient. No language interpreter was used.    Past Medical History  Diagnosis Date  . History of allergic rhinitis   . History of asthma     mild  . History of kyphosis     Shueremann's Kyphosis   Past Surgical History  Procedure Laterality Date  . Elbow fracture surgery  05/1998  . Spinal fusion  2009    sx for shueremann's kyphosis   History reviewed. No pertinent family history. History  Substance Use Topics  . Smoking status: Current Every Day Smoker -- 0.50 packs/day    Types: Cigarettes  . Smokeless tobacco: Former Neurosurgeon    Quit date: 08/26/2008  . Alcohol Use: 1.8 oz/week    3 Cans of beer per week     Comment: occasionally    Review of Systems  Constitutional: Negative for fever and chills.  Respiratory: Negative for shortness of breath.   Cardiovascular: Negative for chest pain.  Gastrointestinal: Positive for nausea, vomiting and abdominal pain. Negative for diarrhea and  constipation.  Genitourinary: Positive for dysuria.  All other systems reviewed and are negative.     Allergies  Aspirin and Methylphenidate hcl  Home Medications   Prior to Admission medications   Medication Sig Start Date End Date Taking? Authorizing Provider  acetaminophen (TYLENOL) 500 MG tablet Take 1,000 mg by mouth every 6 (six) hours as needed for pain.    Yes Historical Provider, MD  albuterol (PROVENTIL HFA;VENTOLIN HFA) 108 (90 BASE) MCG/ACT inhaler Inhale 2 puffs into the lungs every 4 (four) hours as needed for wheezing. 05/03/13  Yes Judy Pimple, MD  naproxen sodium (ANAPROX) 220 MG tablet Take 440 mg by mouth 2 (two) times daily as needed.   Yes Historical Provider, MD  ranitidine (ZANTAC) 75 MG tablet Take 150 mg by mouth once.   Yes Historical Provider, MD  amphetamine-dextroamphetamine (ADDERALL) 10 MG tablet Take 1 tablet by mouth in am and 1 tablet at lunch, then 1/2 tablet at 4 pm if needed for homework Patient not taking: Reported on 04/22/2014 05/03/13   Judy Pimple, MD  ibuprofen (ADVIL,MOTRIN) 800 MG tablet Take 1 tablet (800 mg total) by mouth every 8 (eight) hours as needed for pain (with meals). Patient not taking: Reported on 04/22/2014 07/02/12   Judy Pimple, MD  traMADol Janean Sark) 50 MG tablet Take 2 tablets by mouth up to three times daily for pain Patient not taking: Reported on 04/22/2014 05/03/13   Judy Pimple, MD   BP 143/67 mmHg  Pulse 46  Temp(Src) 98.2  F (36.8 C) (Oral)  Ht 6' (1.829 m)  Wt 190 lb (86.183 kg)  BMI 25.76 kg/m2  SpO2 100% Physical Exam  Constitutional: He is oriented to person, place, and time. He appears well-developed and well-nourished.  HENT:  Head: Normocephalic and atraumatic.  Eyes: Conjunctivae and EOM are normal. Pupils are equal, round, and reactive to light. Right eye exhibits no discharge. Left eye exhibits no discharge. No scleral icterus.  Neck: Normal range of motion. Neck supple. No JVD present.   Cardiovascular: Regular rhythm and normal heart sounds.  Exam reveals no gallop and no friction rub.   No murmur heard. Bradycardic  Pulmonary/Chest: Effort normal and breath sounds normal. No respiratory distress. He has no wheezes. He has no rales. He exhibits no tenderness.  Abdominal: Soft. He exhibits no distension and no mass. There is tenderness. There is no rebound and no guarding.  Left abdominal tenderness, no right lower quadrant tenderness or pain at McBurney's point, no right upper quadrant tenderness or Murphy sign  Musculoskeletal: Normal range of motion. He exhibits no edema or tenderness.  Neurological: He is alert and oriented to person, place, and time.  Skin: Skin is warm and dry.  Psychiatric: He has a normal mood and affect. His behavior is normal. Judgment and thought content normal.  Nursing note and vitals reviewed.   ED Course  Procedures (including critical care time) Results for orders placed or performed during the hospital encounter of 04/22/14  CBC with Differential  Result Value Ref Range   WBC 15.6 (H) 4.0 - 10.5 K/uL   RBC 5.37 4.22 - 5.81 MIL/uL   Hemoglobin 16.3 13.0 - 17.0 g/dL   HCT 16.147.1 09.639.0 - 04.552.0 %   MCV 87.7 78.0 - 100.0 fL   MCH 30.4 26.0 - 34.0 pg   MCHC 34.6 30.0 - 36.0 g/dL   RDW 40.912.4 81.111.5 - 91.415.5 %   Platelets 299 150 - 400 K/uL   Neutrophils Relative % 85 (H) 43 - 77 %   Neutro Abs 13.1 (H) 1.7 - 7.7 K/uL   Lymphocytes Relative 11 (L) 12 - 46 %   Lymphs Abs 1.7 0.7 - 4.0 K/uL   Monocytes Relative 4 3 - 12 %   Monocytes Absolute 0.7 0.1 - 1.0 K/uL   Eosinophils Relative 0 0 - 5 %   Eosinophils Absolute 0.1 0.0 - 0.7 K/uL   Basophils Relative 0 0 - 1 %   Basophils Absolute 0.0 0.0 - 0.1 K/uL  Hepatic function panel  Result Value Ref Range   Total Protein 8.3 6.0 - 8.3 g/dL   Albumin 4.9 3.5 - 5.2 g/dL   AST 22 0 - 37 U/L   ALT 24 0 - 53 U/L   Alkaline Phosphatase 63 39 - 117 U/L   Total Bilirubin 0.8 0.3 - 1.2 mg/dL    Bilirubin, Direct <7.8<0.1 0.0 - 0.3 mg/dL   Indirect Bilirubin NOT CALCULATED 0.3 - 0.9 mg/dL  I-stat chem 8, ed  Result Value Ref Range   Sodium 141 135 - 145 mmol/L   Potassium 4.2 3.5 - 5.1 mmol/L   Chloride 102 96 - 112 mEq/L   BUN 18 6 - 23 mg/dL   Creatinine, Ser 2.950.90 0.50 - 1.35 mg/dL   Glucose, Bld 621132 (H) 70 - 99 mg/dL   Calcium, Ion 3.081.22 6.571.12 - 1.23 mmol/L   TCO2 24 0 - 100 mmol/L   Hemoglobin 17.7 (H) 13.0 - 17.0 g/dL   HCT 84.652.0 96.239.0 -  52.0 %   Ct Abdomen Pelvis W Contrast  04/22/2014   CLINICAL DATA:  Low abdominal pain starting at 2300 last night  EXAM: CT ABDOMEN AND PELVIS WITH CONTRAST  TECHNIQUE: Multidetector CT imaging of the abdomen and pelvis was performed using the standard protocol following bolus administration of intravenous contrast.  CONTRAST:  OMNIPAQUE IOHEXOL 300 MG/ML  SOLN  COMPARISON:  None.  FINDINGS: The lung bases are clear.  There is mild bilateral retroareolar fibroglandular tissue as can be seen with gynecomastia.  The liver demonstrates no focal abnormality. There is no intrahepatic or extrahepatic biliary ductal dilatation. The gallbladder is normal. The spleen demonstrates no focal abnormality. The kidneys, adrenal glands and pancreas are normal. The bladder is unremarkable.  The majority contrast is present within the stomach. The stomach, duodenum, and large intestine demonstrate no contrast extravasation or dilatation. There is mild small bowel wall thickening in the left upper quadrant which may reflect mild enteritis. There is a normal caliber appendix in the right lower quadrant without periappendiceal inflammatory changes. There is no pneumoperitoneum, pneumatosis, or portal venous gas. There is no abdominal or pelvic free fluid. There is no lymphadenopathy.  The abdominal aorta is normal in caliber .  There are no lytic or sclerotic osseous lesions. Posterior thoracolumbar spinal fixation hardware without failure or complication with the superior  portions excluded from the field of view. There is beam hardening artifact resulting from the orthopedic hardware verge partially obscures the adjacent soft tissue and osseous structures.  IMPRESSION: 1. There is mild small bowel wall thickening in the left upper quadrant which may reflect mild enteritis.   Electronically Signed   By: Elige Ko   On: 04/22/2014 09:03      EKG Interpretation None      MDM   Final diagnoses:  Abdominal pain  Enteritis    Patient with abdominal cramping, nausea, and vomiting that started last night. We'll give fluids, pain meds, and Zofran. Check basic labs and will reassess. Doubt surgical process, but if blood work is irregular, consider imaging.   9:24 AM Imaging is negative for surgical abdomen, CT reveals enteritis which is mild and left upper quadrant. Will treat the patient with Zofran and pain medicine. Discharge to home. Patient is afebrile. He is feeling better. Abdominal return precautions given. Patient understands agrees with plan. He is stable and ready for discharge. He is tolerating oral intake.  Patient instructed to return for:  New or worsening symptoms, including, increased abdominal pain, especially pain that localizes to one side, bloody vomit, bloody diarrhea, fever >101, and intractable vomiting.    Roxy Horseman, PA-C 04/22/14 1610  Elwin Mocha, MD 04/23/14 [phone]

## 2014-04-24 ENCOUNTER — Telehealth: Payer: Self-pay | Admitting: *Deleted

## 2014-04-24 NOTE — Telephone Encounter (Signed)
Dr. Milinda Antisower sent me a message saying "Please let pt know I rev ER note- ask how he is feeling - and have him f/u next week"  Called pt and he said he is doing good, his abd pain is getting better. Pt scheduled a f/u appt with Dr. Milinda Antisower on 05/02/14, I did advise pt that if sxs return or worsen to let us know sooner then his f/u appt. next week, pt verbalized understanding

## 2014-05-02 ENCOUNTER — Ambulatory Visit: Payer: BLUE CROSS/BLUE SHIELD | Admitting: Family Medicine

## 2014-05-09 ENCOUNTER — Encounter: Payer: Self-pay | Admitting: Family Medicine

## 2014-05-09 ENCOUNTER — Ambulatory Visit (INDEPENDENT_AMBULATORY_CARE_PROVIDER_SITE_OTHER): Payer: BLUE CROSS/BLUE SHIELD | Admitting: Family Medicine

## 2014-05-09 VITALS — BP 136/78 | HR 64 | Temp 98.5°F | Wt 190.4 lb

## 2014-05-09 DIAGNOSIS — R1012 Left upper quadrant pain: Secondary | ICD-10-CM

## 2014-05-09 DIAGNOSIS — K529 Noninfective gastroenteritis and colitis, unspecified: Secondary | ICD-10-CM

## 2014-05-09 DIAGNOSIS — R1030 Lower abdominal pain, unspecified: Secondary | ICD-10-CM | POA: Insufficient documentation

## 2014-05-09 LAB — CBC WITH DIFFERENTIAL/PLATELET
Basophils Absolute: 0 10*3/uL (ref 0.0–0.1)
Basophils Relative: 0.3 % (ref 0.0–3.0)
EOS PCT: 2.7 % (ref 0.0–5.0)
Eosinophils Absolute: 0.2 10*3/uL (ref 0.0–0.7)
HCT: 48 % (ref 39.0–52.0)
Hemoglobin: 16.1 g/dL (ref 13.0–17.0)
Lymphocytes Relative: 30.4 % (ref 12.0–46.0)
Lymphs Abs: 2.6 10*3/uL (ref 0.7–4.0)
MCHC: 33.6 g/dL (ref 30.0–36.0)
MCV: 87.3 fl (ref 78.0–100.0)
MONO ABS: 0.7 10*3/uL (ref 0.1–1.0)
MONOS PCT: 7.7 % (ref 3.0–12.0)
Neutro Abs: 5 10*3/uL (ref 1.4–7.7)
Neutrophils Relative %: 58.9 % (ref 43.0–77.0)
PLATELETS: 303 10*3/uL (ref 150.0–400.0)
RBC: 5.5 Mil/uL (ref 4.22–5.81)
RDW: 12.8 % (ref 11.5–15.5)
WBC: 8.5 10*3/uL (ref 4.0–10.5)

## 2014-05-09 LAB — LIPASE: LIPASE: 6 U/L — AB (ref 11.0–59.0)

## 2014-05-09 MED ORDER — OMEPRAZOLE 20 MG PO CPDR: 20.0000 mg | DELAYED_RELEASE_CAPSULE | Freq: Every day | ORAL | Status: DC

## 2014-05-09 NOTE — Progress Notes (Signed)
Subjective:    Patient ID: Derrick Roberts, male    DOB: Jan 20, 1991, [age] y.o.   MRN: [id number]  HPI Here with f/u from ED   1/12 in for LUQ abdominal pain and n/v  Had diarrhea  No blood in stool Lab Results  Component Value Date   WBC 15.6* 04/22/2014   HGB 17.7* 04/22/2014   HCT 52.0 04/22/2014   MCV 87.7 04/22/2014   PLT 299 04/22/2014   Ct of abd/pelvis - small bowel wall thickening in LUQ  Labeled enteritis  Given zofran - that helped nausea    Got better  Then worse again Tuesday- stayed out of work (pain but no vomiting or diarrhea)   Similar episodes -not as bad in the past   Feels ok today A little queasy  Eating a bit of toast / one subway sandwhich  Drinking lots of water  No fever   Has been taking aleve for pain -only thing he is taking for pain   Taking zantac 75 bid otc - that helps some / uses with gas  Is prone to heartburn    Patient Active Problem List   Diagnosis Date Noted  . Enteritis 05/09/2014  . Abdominal pain, left upper quadrant 05/09/2014  . Controlled substance agreement signed 05/05/2013  . Back pain 07/02/2012  . PARESTHESIA 06/15/2010  . ADD 10/20/2006  . SCOLIOSIS NEC 10/20/2006  . ASTHMA 10/19/2006  . ECZEMA 10/19/2006   Past Medical History  Diagnosis Date  . History of allergic rhinitis   . History of asthma     mild  . History of kyphosis     Shueremann's Kyphosis   Past Surgical History  Procedure Laterality Date  . Elbow fracture surgery  05/1998  . Spinal fusion  2009    sx for shueremann's kyphosis   History  Substance Use Topics  . Smoking status: Current Every Day Smoker -- 0.50 packs/day    Types: Cigarettes  . Smokeless tobacco: Former NeurosurgeonUser    Quit date: 08/26/2008  . Alcohol Use: 1.8 oz/week    3 Cans of beer per week     Comment: occasionally   No family history on file. Allergies  Allergen Reactions  . Aspirin Nausea Only and Other (See Comments)    Stomach problems  . Methylphenidate  Hcl     REACTION: diarrhea   Current Outpatient Prescriptions on File Prior to Visit  Medication Sig Dispense Refill  . HYDROcodone-acetaminophen (NORCO/VICODIN) 5-325 MG per tablet Take 1 tablet by mouth every 6 (six) hours as needed for moderate pain or severe pain. 6 tablet 0  . ondansetron (ZOFRAN) 4 MG tablet Take 1 tablet (4 mg total) by mouth every 6 (six) hours. 12 tablet 0  . ranitidine (ZANTAC) 75 MG tablet Take 150 mg by mouth once.    Marland Kitchen. acetaminophen (TYLENOL) 500 MG tablet Take 1,000 mg by mouth every 6 (six) hours as needed for pain.     Marland Kitchen. albuterol (PROVENTIL HFA;VENTOLIN HFA) 108 (90 BASE) MCG/ACT inhaler Inhale 2 puffs into the lungs every 4 (four) hours as needed for wheezing. (Patient not taking: Reported on 05/09/2014) 1 Inhaler 3  . amphetamine-dextroamphetamine (ADDERALL) 10 MG tablet Take 1 tablet by mouth in am and 1 tablet at lunch, then 1/2 tablet at 4 pm if needed for homework (Patient not taking: Reported on 04/22/2014) 60 tablet 0  . traMADol (ULTRAM) 50 MG tablet Take 2 tablets by mouth up to three times daily for pain (Patient  not taking: Reported on 04/22/2014) 180 tablet 0   No current facility-administered medications on file prior to visit.    Review of Systems Review of Systems  Constitutional: Negative for fever, and unexpected weight change. pos for poor appetite  Eyes: Negative for pain and visual disturbance.  Respiratory: Negative for cough and shortness of breath.   Cardiovascular: Negative for cp or palpitations    Gastrointestinal: Negative for , diarrhea and constipation. pos for upper abd pain, neg for blood in stool or dark stool  Genitourinary: Negative for urgency and frequency.  Skin: Negative for pallor or rash   Neurological: Negative for weakness, light-headedness, numbness and headaches.  Hematological: Negative for adenopathy. Does not bruise/bleed easily.  Psychiatric/Behavioral: Negative for dysphoric mood. The patient is not  nervous/anxious.         Objective:   Physical Exam  Constitutional: He is oriented to person, place, and time. He appears well-developed and well-nourished. No distress.  HENT:  Head: Normocephalic and atraumatic.  Mouth/Throat: Oropharynx is clear and moist.  Eyes: Conjunctivae and EOM are normal. Pupils are equal, round, and reactive to light. No scleral icterus.  Neck: Normal range of motion. Neck supple. Carotid bruit is not present.  Cardiovascular: Normal rate, regular rhythm, normal heart sounds and intact distal pulses.  Exam reveals no gallop.   Pulmonary/Chest: Effort normal and breath sounds normal. No respiratory distress. He has no wheezes. He has no rales.  Abdominal: Soft. Bowel sounds are normal. He exhibits no distension, no abdominal bruit and no mass. There is no hepatosplenomegaly. There is tenderness in the epigastric area and left upper quadrant. There is no rebound, no guarding and no CVA tenderness.  Musculoskeletal: Normal range of motion. He exhibits no edema or tenderness.  Lymphadenopathy:    He has no cervical adenopathy.  Neurological: He is alert and oriented to person, place, and time. He has normal reflexes. No cranial nerve deficit. He exhibits normal muscle tone. Coordination normal.  Skin: Skin is warm and dry. No rash noted. No erythema. No pallor.  Psychiatric: He has a normal mood and affect.          Assessment & Plan:   Problem List Items Addressed This Visit      Digestive   Enteritis - Primary    Rev ED note and studies/CT  Overall improved but having epigastric pain  Also taking nsaids- adv to stop them for now  Px omeprazole Disc diet Cbc and lipase today  May ref to GI if no improving -pt mentions this is recurrent          Other   Abdominal pain, left upper quadrant    Epigastric and LUQ Rev ED notes and studies  Will stop nsaids and start omeprazole Cbc and lipase today Update if no imp - low threshold for GI ref in  light of this and also enteritis on CT       Relevant Orders   CBC with Differential/Platelet (Completed)   Lipase (Completed)

## 2014-05-09 NOTE — Patient Instructions (Signed)
Avoid foods that make this worse  Take omeprazole 20 mg daily  (you can decide whether to stay on the zantac or not)  Avoid aleve or ibuprofen 1-2 weeks while your stomach is recovering   (in the future never take those type of medicines without food)   Lab today  Instructions from there   If this continues to re occur I will refer you to GI - so keep me updated

## 2014-05-09 NOTE — Progress Notes (Signed)
Pre visit review using our clinic review tool, if applicable. No additional management support is needed unless otherwise documented below in the visit note. 

## 2014-05-11 NOTE — Assessment & Plan Note (Signed)
Epigastric and LUQ Rev ED notes and studies  Will stop nsaids and start omeprazole Cbc and lipase today Update if no imp - low threshold for GI ref in light of this and also enteritis on CT

## 2014-05-11 NOTE — Assessment & Plan Note (Signed)
Rev ED note and studies/CT  Overall improved but having epigastric pain  Also taking nsaids- adv to stop them for now  Px omeprazole Disc diet Cbc and lipase today  May ref to GI if no improving -pt mentions this is recurrent

## 2014-05-15 ENCOUNTER — Encounter: Payer: Self-pay | Admitting: *Deleted

## 2014-06-19 ENCOUNTER — Telehealth: Payer: Self-pay | Admitting: Family Medicine

## 2014-06-19 NOTE — Telephone Encounter (Signed)
Thanks for letting me know  It sounds like he will likely need a GI referral if symptoms are not better - will see what Dr Para Marchuncan thinks

## 2014-06-19 NOTE — Telephone Encounter (Signed)
Mom called stomach not any better has appointment with dr Para Marchduncan tomorrow

## 2014-06-20 ENCOUNTER — Ambulatory Visit (INDEPENDENT_AMBULATORY_CARE_PROVIDER_SITE_OTHER): Payer: BLUE CROSS/BLUE SHIELD | Admitting: Family Medicine

## 2014-06-20 ENCOUNTER — Encounter: Payer: Self-pay | Admitting: Family Medicine

## 2014-06-20 VITALS — BP 118/68 | HR 63 | Temp 98.6°F | Wt 184.5 lb

## 2014-06-20 DIAGNOSIS — R1030 Lower abdominal pain, unspecified: Secondary | ICD-10-CM

## 2014-06-20 MED ORDER — SUCRALFATE 1 G PO TABS: 1.0000 g | ORAL_TABLET | Freq: Three times a day (TID) | ORAL | Status: DC

## 2014-06-20 MED ORDER — ALBUTEROL SULFATE HFA 108 (90 BASE) MCG/ACT IN AERS: 2.0000 | INHALATION_SPRAY | RESPIRATORY_TRACT | Status: AC | PRN

## 2014-06-20 NOTE — Progress Notes (Signed)
Pre visit review using our clinic review tool, if applicable. No additional management support is needed unless otherwise documented below in the visit note.  Abd pain started about 3 months ago.  Worse in the meantime.  Couldn't tolerate any spicy foods, those make it worse.  Taking prilosec helped a little.  Burning pain, comes in waves.  Had been rx'd hydrocodone for pain prev.  No blood in stool.  Not vomiting blood.  Can be nauseated and vomit.  Taking zofran, with some relief.  Lower abd pain, not focal RUQ pain.  Smoking 1/2 ppd, less than prev.  Prilosec helped more than anything else.  No dysuria.   No FH UC or crohn's.    Meds, vitals, and allergies reviewed.   ROS: See HPI.  Otherwise, noncontributory.  nad ncat Mmm, OP wnl Neck supple, no LA rrr ctab abd soft, ttp but w/o rebound in the lower abd, normal BS, no peritoneal signs on exam.   No cva pain Ext well perfused.

## 2014-06-20 NOTE — Telephone Encounter (Signed)
Noted. Thanks.

## 2014-06-20 NOTE — Patient Instructions (Signed)
Shirlee LimerickMarion will call about your referral. Take carafate 4 times a day.  That should help the pain.  Update us Monday.  Take care.  Keep taking the prilosec.   I'll notify Tower.

## 2014-06-22 NOTE — Assessment & Plan Note (Signed)
Now with more lower abd pain.  Not an acute abd.  prilosec helped more than anything prev.  CT with mild small bowel wall thickening prev noted.   At this point, would have him see GI.  Would treat for GERD given the diet related sx above, add on carafate and update us Monday.  D/w pt.  Still okay for outpatient f/u.  No sign of ominous dx based on exam.  >25 minutes spent in face to face time with patient, >50% spent in counselling or coordination of care.  Routed to PCP as FYI.

## 2014-06-25 ENCOUNTER — Other Ambulatory Visit: Payer: Self-pay

## 2014-06-25 ENCOUNTER — Emergency Department (HOSPITAL_COMMUNITY)
Admission: EM | Admit: 2014-06-25 | Discharge: 2014-06-25 | Disposition: A | Discharge status: Home / Self Care | Payer: BLUE CROSS/BLUE SHIELD | Attending: Emergency Medicine | Admitting: Emergency Medicine

## 2014-06-25 ENCOUNTER — Encounter (HOSPITAL_COMMUNITY): Payer: Self-pay | Admitting: *Deleted

## 2014-06-25 ENCOUNTER — Telehealth: Payer: Self-pay | Admitting: Family Medicine

## 2014-06-25 DIAGNOSIS — M549 Dorsalgia, unspecified: Secondary | ICD-10-CM | POA: Diagnosis not present

## 2014-06-25 DIAGNOSIS — Z79899 Other long term (current) drug therapy: Secondary | ICD-10-CM | POA: Diagnosis not present

## 2014-06-25 DIAGNOSIS — J45909 Unspecified asthma, uncomplicated: Secondary | ICD-10-CM | POA: Insufficient documentation

## 2014-06-25 DIAGNOSIS — R1033 Periumbilical pain: Secondary | ICD-10-CM | POA: Insufficient documentation

## 2014-06-25 DIAGNOSIS — Z720 Tobacco use: Secondary | ICD-10-CM | POA: Diagnosis not present

## 2014-06-25 DIAGNOSIS — Z8739 Personal history of other diseases of the musculoskeletal system and connective tissue: Secondary | ICD-10-CM | POA: Diagnosis not present

## 2014-06-25 DIAGNOSIS — R109 Unspecified abdominal pain: Secondary | ICD-10-CM | POA: Diagnosis present

## 2014-06-25 LAB — URINALYSIS, ROUTINE W REFLEX MICROSCOPIC
Bilirubin Urine: NEGATIVE
Glucose, UA: NEGATIVE mg/dL
HGB URINE DIPSTICK: NEGATIVE
KETONES UR: NEGATIVE mg/dL
Leukocytes, UA: NEGATIVE
Nitrite: NEGATIVE
PROTEIN: NEGATIVE mg/dL
Specific Gravity, Urine: 1.023 (ref 1.005–1.030)
Urobilinogen, UA: 0.2 mg/dL (ref 0.0–1.0)
pH: 7.5 (ref 5.0–8.0)

## 2014-06-25 LAB — CBC WITH DIFFERENTIAL/PLATELET
BASOS ABS: 0 10*3/uL (ref 0.0–0.1)
BASOS PCT: 0 % (ref 0–1)
EOS ABS: 0.2 10*3/uL (ref 0.0–0.7)
EOS PCT: 3 % (ref 0–5)
HCT: 46.4 % (ref 39.0–52.0)
Hemoglobin: 16.1 g/dL (ref 13.0–17.0)
Lymphocytes Relative: 24 % (ref 12–46)
Lymphs Abs: 1.9 10*3/uL (ref 0.7–4.0)
MCH: 30.4 pg (ref 26.0–34.0)
MCHC: 34.7 g/dL (ref 30.0–36.0)
MCV: 87.5 fL (ref 78.0–100.0)
Monocytes Absolute: 0.6 10*3/uL (ref 0.1–1.0)
Monocytes Relative: 7 % (ref 3–12)
NEUTROS PCT: 66 % (ref 43–77)
Neutro Abs: 5.2 10*3/uL (ref 1.7–7.7)
PLATELETS: 289 10*3/uL (ref 150–400)
RBC: 5.3 MIL/uL (ref 4.22–5.81)
RDW: 12.5 % (ref 11.5–15.5)
WBC: 7.9 10*3/uL (ref 4.0–10.5)

## 2014-06-25 LAB — COMPREHENSIVE METABOLIC PANEL
ALBUMIN: 5.2 g/dL (ref 3.5–5.2)
ALT: 24 U/L (ref 0–53)
AST: 27 U/L (ref 0–37)
Alkaline Phosphatase: 57 U/L (ref 39–117)
Anion gap: 5 (ref 5–15)
BUN: 13 mg/dL (ref 6–23)
CALCIUM: 9.6 mg/dL (ref 8.4–10.5)
CO2: 26 mmol/L (ref 19–32)
Chloride: 108 mmol/L (ref 96–112)
Creatinine, Ser: 0.79 mg/dL (ref 0.50–1.35)
GFR calc Af Amer: >90 mL/min (ref >90)
GLUCOSE: 113 mg/dL — AB (ref 70–99)
Potassium: 4.2 mmol/L (ref 3.5–5.1)
Sodium: 139 mmol/L (ref 135–145)
TOTAL PROTEIN: 8.4 g/dL — AB (ref 6.0–8.3)
Total Bilirubin: 0.6 mg/dL (ref 0.3–1.2)

## 2014-06-25 LAB — LIPASE, BLOOD: LIPASE: 19 U/L (ref 11–59)

## 2014-06-25 MED ORDER — OMEPRAZOLE 20 MG PO CPDR: 20.0000 mg | DELAYED_RELEASE_CAPSULE | Freq: Every day | ORAL | Status: DC

## 2014-06-25 MED ORDER — HYDROCODONE-ACETAMINOPHEN 5-325 MG PO TABS: 1.0000 | ORAL_TABLET | Freq: Four times a day (QID) | ORAL | Status: DC | PRN

## 2014-06-25 MED ORDER — MORPHINE SULFATE 4 MG/ML IJ SOLN
4.0000 mg | Freq: Once | INTRAMUSCULAR | Status: AC
  Administered 2014-06-25: 4 mg via INTRAVENOUS
  Filled 2014-06-25: qty 1

## 2014-06-25 MED ORDER — MORPHINE SULFATE 4 MG/ML IJ SOLN
8.0000 mg | Freq: Once | INTRAMUSCULAR | Status: AC
  Administered 2014-06-25: 8 mg via INTRAVENOUS
  Filled 2014-06-25: qty 2

## 2014-06-25 NOTE — Telephone Encounter (Signed)
Pt scheduled for 07/03/14 at [facility]. Pt aware

## 2014-06-25 NOTE — ED Provider Notes (Signed)
CSN: [id number]     Arrival date & time 06/25/14  1057 History   First MD Initiated Contact with Patient 06/25/14 1110     Chief Complaint  Patient presents with  . Abdominal Pain     (Consider location/radiation/quality/duration/timing/severity/associated sxs/prior Treatment) HPI  24 year old male presents with periumbilical pain that started around 9 AM. Rates his pain as a 7/10. She comes is mostly a sharp pain that comes in waves. He's been having pain similar to this for the past 3 months since around Christmas. He's been tried on Prilosec and Carafate without any relief. He is being referred to a gastroenterologist by his PCP but has not seen them yet. He was evaluated here a couple months ago and had a negative CT scan. He states this pain is similar to pain he gets every couple days except a little more severe. There is no nausea, vomiting, or fevers. No urinary symptoms.  Past Medical History  Diagnosis Date  . History of allergic rhinitis   . History of asthma     mild  . History of kyphosis     Shueremann's Kyphosis   Past Surgical History  Procedure Laterality Date  . Elbow fracture surgery  05/1998  . Spinal fusion  2009    sx for shueremann's kyphosis   History reviewed. No pertinent family history. History  Substance Use Topics  . Smoking status: Current Every Day Smoker -- 0.50 packs/day    Types: Cigarettes  . Smokeless tobacco: Former NeurosurgeonUser    Quit date: 08/26/2008  . Alcohol Use: 1.8 oz/week    3 Cans of beer per week     Comment: occasionally    Review of Systems  Constitutional: Negative for fever.  Gastrointestinal: Positive for abdominal pain. Negative for nausea, vomiting and diarrhea.  Genitourinary: Negative for dysuria.  Musculoskeletal: Positive for back pain (chronic back pain).  All other systems reviewed and are negative.     Allergies  Aspirin and Methylphenidate hcl  Home Medications   Prior to Admission medications   Medication  Sig Start Date End Date Taking? Authorizing Provider  acetaminophen (TYLENOL) 500 MG tablet Take 1,000 mg by mouth every 6 (six) hours as needed for pain.     Historical Provider, MD  albuterol (PROVENTIL HFA;VENTOLIN HFA) 108 (90 BASE) MCG/ACT inhaler Inhale 2 puffs into the lungs every 4 (four) hours as needed for wheezing. 06/20/14   Joaquim NamGraham S Duncan, MD  HYDROcodone-acetaminophen (NORCO/VICODIN) 5-325 MG per tablet Take 1 tablet by mouth every 6 (six) hours as needed for moderate pain or severe pain. 04/22/14   Roxy Horsemanobert Browning, PA-C  omeprazole (PRILOSEC) 20 MG capsule Take 1 capsule (20 mg total) by mouth daily. 05/09/14   Judy PimpleMarne A Tower, MD  ondansetron (ZOFRAN) 4 MG tablet Take 1 tablet (4 mg total) by mouth every 6 (six) hours. 04/22/14   Roxy Horsemanobert Browning, PA-C  ranitidine (ZANTAC) 75 MG tablet Take 150 mg by mouth once.    Historical Provider, MD  sucralfate (CARAFATE) 1 G tablet Take 1 tablet (1 g total) by mouth 4 (four) times daily -  with meals and at bedtime. 06/20/14   Joaquim NamGraham S Duncan, MD   BP 128/77 mmHg  Pulse 64  Temp(Src) 97.9 F (36.6 C) (Oral)  Resp 16  SpO2 100% Physical Exam  Constitutional: He is oriented to person, place, and time. He appears well-developed and well-nourished.  HENT:  Head: Normocephalic and atraumatic.  Right Ear: External ear normal.  Left Ear: External ear  normal.  Nose: Nose normal.  Eyes: Right eye exhibits no discharge. Left eye exhibits no discharge.  Neck: Neck supple.  Cardiovascular: Normal rate, regular rhythm, normal heart sounds and intact distal pulses.   Pulmonary/Chest: Effort normal.  Abdominal: Soft. There is tenderness in the periumbilical area.  Musculoskeletal: He exhibits no edema.  Neurological: He is alert and oriented to person, place, and time.  Skin: Skin is warm and dry.  Nursing note and vitals reviewed.   ED Course  Procedures (including critical care time) Labs Review Labs Reviewed  COMPREHENSIVE METABOLIC PANEL -  Abnormal; Notable for the following:    Glucose, Bld 113 (*)    Total Protein 8.4 (*)    All other components within normal limits  URINALYSIS, ROUTINE W REFLEX MICROSCOPIC - Abnormal; Notable for the following:    APPearance CLOUDY (*)    All other components within normal limits  CBC WITH DIFFERENTIAL/PLATELET  LIPASE, BLOOD    Imaging Review No results found.   EKG Interpretation None      MDM   Final diagnoses:  Periumbilical abdominal pain    Patient states his pain is similar to multiple prior exacerbations over past few months. No focal RLQ pain or upper abdominal pain to suggest appy, cholecystitis, pancreatitis, etc. Well appearing. Pain improved here, no N/V. Given this pain is almost exactly the same as when he had a normal CT a few months ago, I do not feel imaging is indicated. He has GI f/u being set up, will give short course of pain control for now.     Pricilla Loveless, MD 06/25/14 [phone]

## 2014-06-25 NOTE — Discharge Instructions (Signed)

## 2014-06-25 NOTE — ED Notes (Addendum)
Pt reports similar episodes in the past of abdominal pain, pt went to pcp on Friday, pcp is working on getting pt into see GI specialist. Pt reports this abd pain episode started around 0900. Pain 7/10. Last bowel movement. Denies dysuria. Denies n/v.

## 2014-06-25 NOTE — Telephone Encounter (Signed)
Pt called stating he called this morning checking on referral to gi dr

## 2014-07-01 ENCOUNTER — Telehealth: Payer: Self-pay | Admitting: *Deleted

## 2014-07-01 ENCOUNTER — Telehealth: Payer: Self-pay | Admitting: Family Medicine

## 2014-07-01 MED ORDER — HYDROCODONE-ACETAMINOPHEN 5-325 MG PO TABS: 1.0000 | ORAL_TABLET | Freq: Four times a day (QID) | ORAL | Status: DC | PRN

## 2014-07-01 NOTE — Telephone Encounter (Signed)
Patient mother aware of hydrocodone rx and to follow up with GI.

## 2014-07-01 NOTE — Telephone Encounter (Signed)
Opened in error

## 2014-07-01 NOTE — Telephone Encounter (Signed)
Hydrocodone printed for pick up  This is what they gave him in the ER  F/u with GI as planned

## 2014-07-01 NOTE — Telephone Encounter (Signed)
Patient Name: Derrick RankinsYLER Carnell DOB: 01/19/1991 Initial Comment Caller states son c/o lower abdominal pain, requests pain Rx Nurse Assessment Nurse: Tiburcio PeaHarris, RN, Meagan Date/Time (Eastern Time): 07/01/2014 10:37:46 AM Confirm and document reason for call. If symptomatic, describe symptoms. ---Caller states son has been having stomach problems and was seen in the hospital last week and does see specialist at 8 am and is wanting pain medications. Caller states having severe pain in lower abdomen and is not sure what to do. Seeing specialist on Thursday morning at 8 am. Has the patient traveled out of the country within the last 30 days? ---No Does the patient require triage? ---Declined Triage Please document clinical information provided and list any resource used. ---Caller is not sure what to do and is wanting to know if pain medication can be called in for her son to get him through to see the specialist. Will call office to see what recommendations are. Called back line [phone] Choose option 8 Spoke with Rene KocherRegina in office and recommended that I send a message over to the office and they will have Dr. Milinda Antisower review it but if pain persist to have him seen in ED. Advised caller I would be sending the message over and if she does not hear from them or if the pain continues to get worse to have him seen in the ED. Guidelines Guideline Title Affirmed Question Affirmed Notes Final Disposition User

## 2014-07-03 ENCOUNTER — Ambulatory Visit (INDEPENDENT_AMBULATORY_CARE_PROVIDER_SITE_OTHER): Payer: BLUE CROSS/BLUE SHIELD | Admitting: Physician Assistant

## 2014-07-03 ENCOUNTER — Encounter: Payer: Self-pay | Admitting: Physician Assistant

## 2014-07-03 ENCOUNTER — Other Ambulatory Visit (INDEPENDENT_AMBULATORY_CARE_PROVIDER_SITE_OTHER): Payer: BLUE CROSS/BLUE SHIELD

## 2014-07-03 VITALS — BP 124/70 | HR 66 | Ht 72.0 in | Wt 179.0 lb

## 2014-07-03 DIAGNOSIS — K529 Noninfective gastroenteritis and colitis, unspecified: Secondary | ICD-10-CM

## 2014-07-03 DIAGNOSIS — R112 Nausea with vomiting, unspecified: Secondary | ICD-10-CM | POA: Diagnosis not present

## 2014-07-03 DIAGNOSIS — R634 Abnormal weight loss: Secondary | ICD-10-CM

## 2014-07-03 LAB — CBC WITH DIFFERENTIAL/PLATELET
BASOS PCT: 0.3 % (ref 0.0–3.0)
Basophils Absolute: 0 10*3/uL (ref 0.0–0.1)
EOS PCT: 2.2 % (ref 0.0–5.0)
Eosinophils Absolute: 0.2 10*3/uL (ref 0.0–0.7)
HEMATOCRIT: 45.5 % (ref 39.0–52.0)
Hemoglobin: 15.6 g/dL (ref 13.0–17.0)
LYMPHS ABS: 2 10*3/uL (ref 0.7–4.0)
Lymphocytes Relative: 24.5 % (ref 12.0–46.0)
MCHC: 34.3 g/dL (ref 30.0–36.0)
MCV: 86.3 fl (ref 78.0–100.0)
MONO ABS: 0.7 10*3/uL (ref 0.1–1.0)
Monocytes Relative: 8.6 % (ref 3.0–12.0)
Neutro Abs: 5.2 10*3/uL (ref 1.4–7.7)
Neutrophils Relative %: 64.4 % (ref 43.0–77.0)
Platelets: 308 10*3/uL (ref 150.0–400.0)
RBC: 5.28 Mil/uL (ref 4.22–5.81)
RDW: 12.8 % (ref 11.5–15.5)
WBC: 8.1 10*3/uL (ref 4.0–10.5)

## 2014-07-03 LAB — COMPREHENSIVE METABOLIC PANEL
ALT: 15 U/L (ref 0–53)
AST: 18 U/L (ref 0–37)
Albumin: 4.8 g/dL (ref 3.5–5.2)
Alkaline Phosphatase: 61 U/L (ref 39–117)
BUN: 9 mg/dL (ref 6–23)
CALCIUM: 10 mg/dL (ref 8.4–10.5)
CHLORIDE: 104 meq/L (ref 96–112)
CO2: 28 meq/L (ref 19–32)
CREATININE: 0.83 mg/dL (ref 0.40–1.50)
GFR: 122.22 mL/min (ref >60.00)
GLUCOSE: 101 mg/dL — AB (ref 70–99)
Potassium: 4.7 mEq/L (ref 3.5–5.1)
Sodium: 138 mEq/L (ref 135–145)
Total Bilirubin: 0.6 mg/dL (ref 0.2–1.2)
Total Protein: 8 g/dL (ref 6.0–8.3)

## 2014-07-03 LAB — IGA: IGA: 196 mg/dL (ref 68–378)

## 2014-07-03 LAB — SEDIMENTATION RATE: Sed Rate: 6 mm/hr (ref 0–22)

## 2014-07-03 LAB — LIPASE: Lipase: 17 U/L (ref 11.0–59.0)

## 2014-07-03 LAB — TSH: TSH: 1.12 u[IU]/mL (ref 0.35–4.50)

## 2014-07-03 LAB — AMYLASE: AMYLASE: 22 U/L — AB (ref 27–131)

## 2014-07-03 MED ORDER — METOCLOPRAMIDE HCL 5 MG/5ML PO SOLN: 10.0000 mg | Freq: Once | ORAL | Status: DC

## 2014-07-03 MED ORDER — SUCRALFATE 1 G PO TABS: 1.0000 g | ORAL_TABLET | Freq: Three times a day (TID) | ORAL | Status: DC

## 2014-07-03 MED ORDER — HYDROCODONE-ACETAMINOPHEN 7.5-325 MG PO TABS: 1.0000 | ORAL_TABLET | Freq: Three times a day (TID) | ORAL | Status: DC

## 2014-07-03 NOTE — Progress Notes (Signed)
i agree with the above note, plan 

## 2014-07-03 NOTE — Progress Notes (Signed)
Patient ID: Derrick Roberts, male   DOB: 04-16-1990, 24 y.o.   MRN: [id number]    HPI:  Derrick Roberts is a 24 y.o.   male  referred by Derrick Ghent, MD due to complaints of abdominal pain, nausea, and weight loss.  Derrick Roberts has a history of asthma, ADD, and Scheuermann's disease. He had back surgery in 2009 by Dr. Chiquita Loth at [facility] at which time he had a 13 disc chain fusion. His kyphosis was corrected and he gained 3 inches in height. Derrick Roberts reports that over the past year he has been experiencing heartburn on a daily basis. He was initially using Zantac with varying degrees of relief. Since Christmas, his heartburn has been worse and he has been having epigastric and lower abdominal pain associated with nausea and intermittent vomiting. He was started on Prilosec, and has less heartburn but still has abdominal pain. More recently, he was started on Carafate which he feels helps his epigastric pain but he continues to have left-sided abdominal pain. Times per week. His nausea is worse on an empty stomach but is alleviated only for several minutes with ingestion of small amounts of food. His bowel movements have been soft formed to mushy and occur once or twice in the morning. He has no nocturnal stooling. He has not noticed any bright red blood per rectum or melena. He does have occasional mucus with his stools but does have tenesmus and often feels he has to move his bowels but nothing comes out. He has been having difficulty starting his urinary stream intermittently. He was seen in the emergency room on January 12 and had an abdominal pelvic CT that showed some left sided small bowel thickening.  At the onset of his pain he had been using ibuprofen for back pain. He began to notice that anything acidic or carbonated would cause him to have pain and nausea. His abdominal pain is exacerbated with ingestion of food and does not always alleviated with defecation. He has lost between 30 and 35 pounds since  early January because he feels so nauseous and has abdominal pain. He denies dysphagia or odynophagia. He admits to smoking a half pack of cigarettes daily for the past 3 years. He says he stopped drinking alcohol 3 weeks ago. He is accompanied by his mother to this visit and states that prior to stopping drinking he would have 2 beers daily. However, review of office notes from his primary care provider in December state that he often drank a 6 pack to get to sleep. He denies intravenous drug abuse, intranasal cocaine, or use of marijuana, however review of notes from his primary care provider show that he had used marijuana on January 15. The notes state he had taken only one hit on the 15th and prior to that had last used [facility] at the end of December. He has been using hydrocodone for his abdominal pain. He is employed by [employer].  He has not had any unusual oral ulcers, eye pain, or skin rashes. He has been having increased low back pain and points to the SI area. 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 He denies a family history of ulcerative colitis, Crohn's disease, colon polyps, or colon cancer. Past Medical History  Diagnosis Date  . History of allergic rhinitis   . History of asthma     mild  . History  of kyphosis     Shueremann's Kyphosis    Past Surgical History  Procedure Laterality Date  . Elbow fracture surgery  05/1998  . Spinal fusion  2009    sx for shueremann's kyphosis   Family History  Problem Relation Age of Onset  . Ulcers Maternal Grandmother   . Irritable bowel syndrome Mother   . Lung cancer Maternal Grandmother    History  Substance Use Topics  . Smoking status: Current Every Day Smoker -- 0.50 packs/day    Types: Cigarettes  . Smokeless tobacco: Former Systems developer    Quit date: 08/26/2008  . Alcohol Use: 1.8 oz/week    3 Cans of beer per week     Comment:  occasionally   Current Outpatient Prescriptions  Medication Sig Dispense Refill  . acetaminophen (TYLENOL) 500 MG tablet Take 1,000 mg by mouth every 6 (six) hours as needed for pain.     Marland Kitchen albuterol (PROVENTIL HFA;VENTOLIN HFA) 108 (90 BASE) MCG/ACT inhaler Inhale 2 puffs into the lungs every 4 (four) hours as needed for wheezing. 1 Inhaler 0  . HYDROcodone-acetaminophen (NORCO) 5-325 MG per tablet Take 1 tablet by mouth every 6 (six) hours as needed for severe pain. 15 tablet 0  . HYDROcodone-acetaminophen (NORCO) 7.5-325 MG per tablet Take 1 tablet by mouth 3 (three) times daily. 15 tablet 0  . metoCLOPramide (REGLAN) 5 MG/5ML solution Take 10 mLs (10 mg total) by mouth once. 20 mL 0  . omeprazole (PRILOSEC) 20 MG capsule Take 1 capsule (20 mg total) by mouth daily. 90 capsule 3  . ondansetron (ZOFRAN) 4 MG tablet Take 1 tablet (4 mg total) by mouth every 6 (six) hours. 12 tablet 0  . ranitidine (ZANTAC) 150 MG capsule Take 150 mg by mouth daily as needed for heartburn.    . sucralfate (CARAFATE) 1 G tablet Take 1 tablet (1 g total) by mouth 4 (four) times daily -  with meals and at bedtime. 40 tablet 1  . sucralfate (CARAFATE) 1 G tablet Take 1 tablet (1 g total) by mouth 4 (four) times daily -  with meals and at bedtime. 120 tablet 0   No current facility-administered medications for this visit.   Allergies  Allergen Reactions  . Aspirin Nausea Only and Other (See Comments)    Stomach problems  . Methylphenidate Hcl     REACTION: diarrhea     Review of Systems: Gen:Has lost 30-35 pounds since late Dec/early Jan. CV: Denies chest pain, angina, palpitations, syncope, orthopnea, PND, peripheral edema, and claudication. Resp: Denies dyspnea at rest, dyspnea with exercise, cough, sputum, wheezing, coughing up blood, and pleurisy. GI: Denies vomiting blood, jaundice, and fecal incontinence.   Denies dysphagia or odynophagia. GU : Denies urinary burning, blood in urine, urinary  frequency, urinary hesitancy, nocturnal urination, and urinary incontinence. MS: Denies joint pain, limitation of movement, and swelling, stiffness, low back pain, extremity pain. Denies muscle weakness, cramps, atrophy.  Derm: Denies rash, itching, dry skin, hives, moles, warts, or unhealing ulcers.  Psych: Denies depression, anxiety, memory loss, suicidal ideation, hallucinations, paranoia, and confusion. Heme: Denies bruising, bleeding, and enlarged lymph nodes. Neuro:  Denies any headaches, dizziness, paresthesias. Endo:  Denies any problems with DM, thyroid, adrenal function  Studies: Pelvis W Contrast   Status: Final result       PACS Images     Show images for CT Abdomen Pelvis W Contrast     Study Result     CLINICAL DATA: Low abdominal pain starting  at 2300 last night  EXAM: CT ABDOMEN AND PELVIS WITH CONTRAST  TECHNIQUE: Multidetector CT imaging of the abdomen and pelvis was performed using the standard protocol following bolus administration of intravenous contrast.  CONTRAST: 16m OMNIPAQUE IOHEXOL 300 MG/ML SOLN  COMPARISON: None.  FINDINGS: The lung bases are clear.  There is mild bilateral retroareolar fibroglandular tissue as can be seen with gynecomastia.  The liver demonstrates no focal abnormality. There is no intrahepatic or extrahepatic biliary ductal dilatation. The gallbladder is normal. The spleen demonstrates no focal abnormality. The kidneys, adrenal glands and pancreas are normal. The bladder is unremarkable.  The majority contrast is present within the stomach. The stomach, duodenum, and large intestine demonstrate no contrast extravasation or dilatation. There is mild small bowel wall thickening in the left upper quadrant which may reflect mild enteritis. There is a normal caliber appendix in the right lower quadrant without periappendiceal inflammatory changes. There is no pneumoperitoneum, pneumatosis, or portal  venous gas. There is no abdominal or pelvic free fluid. There is no lymphadenopathy.  The abdominal aorta is normal in caliber .  There are no lytic or sclerotic osseous lesions. Posterior thoracolumbar spinal fixation hardware without failure or complication with the superior portions excluded from the field of view. There is beam hardening artifact resulting from the orthopedic hardware verge partially obscures the adjacent soft tissue and osseous structures.  IMPRESSION: 1. There is mild small bowel wall thickening in the left upper quadrant which may reflect mild enteritis.   Electronically Signed  By: HKathreen Devoid On: 04/22/2014 09:03     LAB RESULTS: CBC on 06/25/2014 had white blood cell count 7.9, hemoglobin 16.1, hematocrit 46.4, platelets 289,000.    Physical Exam: BP 124/70 mmHg  Pulse 66  Ht 6' (1.829 m)  Wt 179 lb (81.194 kg)  BMI 24.27 kg/m2 Constitutional: Pleasant,well-developed, thin, -pale male in no acute distress. HEENT: Normocephalic and atraumatic. Conjunctivae are normal. No scleral icterus. Neck supple. No thyromegaly Cardiovascular: Normal rate, regular rhythm.  Pulmonary/chest: Effort normal and breath sounds normal. No wheezing, rales or rhonchi. Abdominal: Soft, nondistended, tender  Left mid abd and LLQ, Bowel sounds active throughout. There are no masses palpable. No hepatomegaly. Extremities: no edema Spine: well healed incision, mild tenderness over SI area, ache with flexion and extension Lymphadenopathy: No cervical adenopathy noted. Neurological: Alert and oriented to person place and time. Skin: Skin is warm and dry. No rashes noted. Psychiatric: Normal mood and affect. Behavior is normal.  ASSESSMENT AND PLAN: 24year old male with a one-year history of heartburn and a 3 month history of epigastric and lower abdominal pain, nausea, vomiting, and abnormal weight loss, with CT findings of some left sided small bowel  thickening, referred for evaluation. Patient is to be scheduled for an EGD to evaluate for esophagitis, gastritis, or ulcers. He will also be scheduled for colonoscopy to evaluate for an inflammatory bowel process or other intraluminal pathology.The risks, benefits, and alternatives to endoscopy with possible biopsy and possible dilation were discussed with the patient and they consent to proceed. The risks, benefits, and alternatives to colonoscopy with possible biopsy and possible polypectomy were discussed with the patient and they consent to proceed. If these are nonrevealing the patient may need a PillCam or enterography for further evaluation of the small bowel. In the interim a repeat CBC will be obtained along with a comprehensive metabolic panel, amylase, lipase, TSH, ESR, IgA, TTG, [facility] IBD S GI panel. He has been instructed to adhere to a low  residue diet. He will use metoclopramide 5 mg per 5 ML's 2 teaspoons 30 minutes prior to his bowel prep. His Carafate has been refilled and he has been instructed to continue his omeprazole. He has been given a prescription for Vicodin 7.5/325 one by mouth 3 times a day when necessary #15 with no refill. All further pain meds are to come from his PCP. His procedures will be scheduled with Dr. Ardis Hughs. Other recommendations will be made pending the findings of the above testing.    Glady Ouderkirk, Deloris Ping 07/03/2014, 11:34 AM  CC: Derrick Ghent, MD

## 2014-07-03 NOTE — Patient Instructions (Signed)
You have been scheduled for an endoscopy and colonoscopy. Please follow the written instructions given to you at your visit today. Please pick up your prep supplies at the pharmacy within the next 1-3 days. If you use inhalers (even only as needed), please bring them with you on the day of your procedure. Your physician has requested that you go to www.startemmi.com and enter the access code given to you at your visit today. This web site gives a general overview about your procedure. However, you should still follow specific instructions given to you by our office regarding your preparation for the procedure. Your physician has requested that you go to the basement for lab work before leaving today. We have sent medications to your pharmacy for you to pick up at your convenience. CC:  [name] MD

## 2014-07-04 LAB — TISSUE TRANSGLUTAMINASE, IGA: Tissue Transglutaminase Ab, IgA: 1 U/mL (ref <4)

## 2014-07-07 ENCOUNTER — Other Ambulatory Visit: Payer: Self-pay | Admitting: Family Medicine

## 2014-07-07 ENCOUNTER — Telehealth: Payer: Self-pay | Admitting: *Deleted

## 2014-07-07 NOTE — Telephone Encounter (Signed)
Patient notified of recommendations. 

## 2014-07-07 NOTE — Telephone Encounter (Signed)
Called patient to give him results. He states he called the on call MD on Saturday because his pain medications were not helping his lower abdominal pain. He states he was told to go to ED or call Monday. He did not go to ED. He states he only has 2 pain pills left. Please, advise.

## 2014-07-07 NOTE — Telephone Encounter (Signed)
Per office note--all other pain meds to come from PCP. He has a controlled substance agreement with his PCP (see Dr Lucretia Roersowers note from 04/2013), and all pain meds should come from PCP. If his pain is so severe that his meds are not holding him, to ER.

## 2014-07-08 LAB — IBD EXPANDED PANEL
ACCA: 38 U (ref 0–90)
ALCA: 55 U (ref 0–60)
AMCA: 120 units — ABNORMAL HIGH (ref 0–100)
Atypical pANCA: NEGATIVE
gASCA: 12 units (ref 0–50)

## 2014-07-09 ENCOUNTER — Other Ambulatory Visit: Payer: Self-pay

## 2014-07-09 NOTE — Telephone Encounter (Signed)
Pt left v/m requesting rx hydrocodone apap 7.5/325 for abd pain. Call when ready for pick up. Pt is scheduled for endoscopy 07/15/14.

## 2014-07-10 MED ORDER — HYDROCODONE-ACETAMINOPHEN 7.5-325 MG PO TABS: 1.0000 | ORAL_TABLET | Freq: Three times a day (TID) | ORAL | Status: DC

## 2014-07-10 NOTE — Telephone Encounter (Signed)
Patient advised.  Rx left at front desk for pick up. 

## 2014-07-10 NOTE — Telephone Encounter (Signed)
Printed.  Thanks.  

## 2014-07-11 ENCOUNTER — Other Ambulatory Visit: Payer: Self-pay | Admitting: *Deleted

## 2014-07-11 ENCOUNTER — Telehealth: Payer: Self-pay | Admitting: Physician Assistant

## 2014-07-11 MED ORDER — MOVIPREP 100 G PO SOLR: 1.0000 | ORAL | Status: DC

## 2014-07-14 ENCOUNTER — Telehealth: Payer: Self-pay | Admitting: Physician Assistant

## 2014-07-15 ENCOUNTER — Ambulatory Visit (AMBULATORY_SURGERY_CENTER): Payer: BLUE CROSS/BLUE SHIELD | Admitting: Gastroenterology

## 2014-07-15 ENCOUNTER — Encounter: Payer: Self-pay | Admitting: Gastroenterology

## 2014-07-15 VITALS — BP 120/66 | HR 48 | Temp 97.1°F | Resp 14 | Ht 72.0 in | Wt 179.0 lb

## 2014-07-15 DIAGNOSIS — K2990 Gastroduodenitis, unspecified, without bleeding: Secondary | ICD-10-CM | POA: Diagnosis not present

## 2014-07-15 DIAGNOSIS — R634 Abnormal weight loss: Secondary | ICD-10-CM

## 2014-07-15 DIAGNOSIS — K2950 Unspecified chronic gastritis without bleeding: Secondary | ICD-10-CM | POA: Diagnosis not present

## 2014-07-15 DIAGNOSIS — R109 Unspecified abdominal pain: Secondary | ICD-10-CM

## 2014-07-15 DIAGNOSIS — K2970 Gastritis, unspecified, without bleeding: Secondary | ICD-10-CM | POA: Diagnosis not present

## 2014-07-15 DIAGNOSIS — R112 Nausea with vomiting, unspecified: Secondary | ICD-10-CM | POA: Diagnosis not present

## 2014-07-15 MED ORDER — SODIUM CHLORIDE 0.9 % IV SOLN: 500.0000 mL | INTRAVENOUS | Status: DC

## 2014-07-15 NOTE — Op Note (Signed)
[facility] [address] [place], [id number]   ENDOSCOPY PROCEDURE REPORT  PATIENT: [name], Derrick D  MR#: [id number] BIRTHDATE: January 16, 1991 , 23  yrs. old GENDER: male ENDOSCOPIST: Rachael Feeaniel P Jacobs, MD PROCEDURE DATE:  07/15/2014 PROCEDURE:  EGD w/ biopsy ASA CLASS:     Class II INDICATIONS:  abdominal pain, nausea, vomiting, was taking 4 alleve daily for many years, stopped 1 month ago and has been on ppi twice daily since then. MEDICATIONS: Monitored anesthesia care and Propofol 50 mg IV TOPICAL ANESTHETIC: none  DESCRIPTION OF PROCEDURE: After the risks benefits and alternatives of the procedure were thoroughly explained, informed consent was obtained.  The LB [id number] endoscope was introduced through the mouth and advanced to the second portion of the duodenum , Without limitations.  The instrument was slowly withdrawn as the mucosa was fully examined.  There was moderate, non-specific pan-gastritis.  This was biopsied and sent to pathology.  The examination was otherwise normal. Retroflexed views revealed no abnormalities.     The scope was then withdrawn from the patient and the procedure completed. COMPLICATIONS: There were no immediate complications.  ENDOSCOPIC IMPRESSION: There was moderate, non-specific pan-gastritis.  This was biopsied and sent to pathology.  The examination was otherwise normal  RECOMMENDATIONS: If the biopsies show H.  pylori, you will be start on appropriate antibiotics.  For now, continue to avoid NSAIDs as best as possible and take PPI (prilosec) twice daily.  My office will contact you to schedule follow up in 6-7 weeks.   eSigned:  Rachael Feeaniel P Jacobs, MD 07/15/2014 8:31 AM    CC: [name], MD

## 2014-07-15 NOTE — Op Note (Signed)
[facility] [address] [place], [id number]   COLONOSCOPY PROCEDURE REPORT  PATIENT: [name], Derrick D  MR#: [id number] BIRTHDATE: 18-Mar-1991 , 23  yrs. old GENDER: male ENDOSCOPIST: Rachael Feeaniel P Jacobs, MD REFERRED NW:GNFAOBY:Marne Fransisca ConnorsA Tower, M.D. PROCEDURE DATE:  07/15/2014 PROCEDURE:   Colonoscopy, diagnostic First Screening Colonoscopy - Avg.  risk and is 50 yrs.  old or older - No.  Prior Negative Screening - Now for repeat screening. N/A  History of Adenoma - Now for follow-up colonoscopy & has been > or = to 3 yrs.  N/A ASA CLASS:   Class II INDICATIONS:abdominal pain (lower abdomen, CT suggested focal enteritis, equivocal IBD markers). MEDICATIONS: Monitored anesthesia care and Propofol 250 mg IV  DESCRIPTION OF PROCEDURE:   After the risks benefits and alternatives of the procedure were thoroughly explained, informed consent was obtained.  The digital rectal exam revealed no abnormalities of the rectum.   The LB [id number]  endoscope was introduced through the anus and advanced to the terminal ileum which was intubated for a short distance. No adverse events experienced.   The quality of the prep was good.  The instrument was then slowly withdrawn as the colon was fully examined.   COLON FINDINGS: The examined terminal ileum appeared to be normal. A normal appearing cecum, ileocecal valve, and appendiceal orifice were identified.  The ascending, transverse, descending, sigmoid colon, and rectum appeared unremarkable.  Retroflexed views revealed no abnormalities. The time to cecum = 2.5 Withdrawal time = 7.7   The scope was withdrawn and the procedure completed. COMPLICATIONS: There were no immediate complications.  ENDOSCOPIC IMPRESSION: 1.   The examined terminal ileum appeared to be normal 2.   Normal colonoscopy  RECOMMENDATIONS: Will proceed with EGD now.  eSigned:  Rachael Feeaniel P Jacobs, MD 07/15/2014 8:26 AM

## 2014-07-15 NOTE — Progress Notes (Signed)
Report to PACU, RN, vss, BBS= Clear.  

## 2014-07-15 NOTE — Patient Instructions (Signed)

## 2014-07-15 NOTE — Progress Notes (Signed)
Called to room to assist during endoscopic procedure.  Patient ID and intended procedure confirmed with present staff. Received instructions for my participation in the procedure from the performing physician.  

## 2014-07-15 NOTE — Telephone Encounter (Signed)
Routed to Chales AbrahamsPatty Lewis

## 2014-07-15 NOTE — Progress Notes (Addendum)
As soon as pt. Arrived in recovery pt. Started requesting pain medication,pain level of 6 in lower abdomen and lower back,pt. Stated that is why he came in to see doctor. Pt. Abdomen soft and expelling air. DR. At bedside and speaking with mother and questioning doctor about pain medication. Dr. Christella HartiganJacobs advising pt. And family to avoid NSAIDS and for him to take tylenol and follow up with PCP for back and pain medication. Verified with Dr. Christella HartiganJacobs and he stated that he does not want to give anything stronger for pain medication.

## 2014-07-16 ENCOUNTER — Telehealth: Payer: Self-pay | Admitting: *Deleted

## 2014-07-16 NOTE — Telephone Encounter (Signed)
  Follow up Call-  Call back number 07/15/2014  Post procedure Call Back phone  # [phone]  Permission to leave phone message Yes     Patient questions:  Do you have a fever, pain , or abdominal swelling? No. Pain Score  0 *  Have you tolerated food without any problems? Yes.    Have you been able to return to your normal activities? Yes.    Do you have any questions about your discharge instructions: Diet   No. Medications  No. Follow up visit  No.  Do you have questions or concerns about your Care? No.  Actions: * If pain score is 4 or above: No action needed, pain <4.

## 2014-07-18 ENCOUNTER — Telehealth: Payer: Self-pay | Admitting: Gastroenterology

## 2014-07-18 ENCOUNTER — Other Ambulatory Visit: Payer: Self-pay | Admitting: Family Medicine

## 2014-07-18 MED ORDER — SUCRALFATE 1 G PO TABS: 1.0000 g | ORAL_TABLET | Freq: Three times a day (TID) | ORAL | Status: DC

## 2014-07-18 NOTE — Telephone Encounter (Signed)
rx sent to the pharmacy. 

## 2014-07-23 ENCOUNTER — Encounter: Payer: Self-pay | Admitting: Gastroenterology

## 2014-07-28 ENCOUNTER — Telehealth: Payer: Self-pay | Admitting: Gastroenterology

## 2014-07-28 DIAGNOSIS — R109 Unspecified abdominal pain: Secondary | ICD-10-CM

## 2014-07-28 NOTE — Telephone Encounter (Signed)
Left message on machine to call back  

## 2014-07-31 NOTE — Telephone Encounter (Signed)
You have been scheduled for an abdominal ultrasound at [facility] (1st floor of hospital) on 08/05/14 at 8 am. Please arrive 15 minutes prior to your appointment for registration. Make certain not to have anything to eat or drink 6 hours prior to your appointment. Should you need to reschedule your appointment, please contact radiology at [phone]. This test typically takes about 30 minutes to perform.  The pt has been instructed regarding the US and labs He will call with any further concerns or questions.

## 2014-07-31 NOTE — Telephone Encounter (Signed)
Please confirm he is completely avoiding all NSAIDS and ASA.  If he is avoiding them, then he needs further testing (repeat cbc, cmet, esr today) also abdominal US (for abd pains).  If he is not avoiding NSAIDs ASA, he needs to... Completely, but would still get the tests already mentioned. 

## 2014-07-31 NOTE — Telephone Encounter (Signed)
Pt has continued abd pain and pressure.  He is taking carafate, prilosec as directed.  He has episodes that last 2-6 hours, everyday this week.  Please advise

## 2014-08-05 ENCOUNTER — Ambulatory Visit (HOSPITAL_COMMUNITY): Admission: RE | Admit: 2014-08-05 | Payer: BLUE CROSS/BLUE SHIELD | Source: Ambulatory Visit

## 2014-08-10 ENCOUNTER — Other Ambulatory Visit: Payer: Self-pay | Admitting: Gastroenterology

## 2014-08-10 MED ORDER — SUCRALFATE 1 G PO TABS: 1.0000 g | ORAL_TABLET | Freq: Three times a day (TID) | ORAL | Status: DC

## 2014-09-01 ENCOUNTER — Telehealth: Payer: Self-pay | Admitting: Gastroenterology

## 2014-09-01 MED ORDER — SUCRALFATE 1 G PO TABS: 1.0000 g | ORAL_TABLET | Freq: Three times a day (TID) | ORAL | Status: DC

## 2014-09-01 NOTE — Telephone Encounter (Signed)
Pt prescription has been sent as requested  

## 2014-09-12 ENCOUNTER — Ambulatory Visit (INDEPENDENT_AMBULATORY_CARE_PROVIDER_SITE_OTHER): Payer: BLUE CROSS/BLUE SHIELD | Admitting: Gastroenterology

## 2014-09-12 ENCOUNTER — Encounter: Payer: Self-pay | Admitting: Gastroenterology

## 2014-09-12 VITALS — BP 108/66 | HR 72 | Ht 72.0 in | Wt 167.0 lb

## 2014-09-12 DIAGNOSIS — R109 Unspecified abdominal pain: Secondary | ICD-10-CM | POA: Diagnosis not present

## 2014-09-12 MED ORDER — HYDROCODONE-ACETAMINOPHEN 7.5-325 MG PO TABS: 1.0000 | ORAL_TABLET | ORAL | Status: DC | PRN

## 2014-09-12 MED ORDER — SUCRALFATE 1 G PO TABS: 1.0000 g | ORAL_TABLET | Freq: Four times a day (QID) | ORAL | Status: DC

## 2014-09-12 MED ORDER — OMEPRAZOLE 40 MG PO CPDR: 40.0000 mg | DELAYED_RELEASE_CAPSULE | Freq: Every day | ORAL | Status: DC

## 2014-09-12 NOTE — Progress Notes (Signed)
Review of pertinent gastrointestinal problems: 1. abd pain, vomiting, ? enteritis on CT 04/2014 "mild small bowel thickening in LUQ that may reflect enteritis;" labs 06/2014 sed rate normal, CBC, cmet normal, IBD serologies equivocal for IBD, crohn's. EGD 07/2014 mild gastritis, biopseis neg for H. Pylori. colonoscopy 07/2014 normal to the terminal ileum. He was taking 4 alleve daily, 2 ibuprofen daily.   HPI: This is a  very pleasant 24 year old man who is here with his mother today. I last saw him about 2 months ago. He has lost about 13 pounds since then.  Chief complaint is abdominal pain, usually after eating  Still losing weight; not eating much.    When he eats he can have terrible pain lower abd, lower back, vomiting.  Back surgery at [facility]   Past Medical History  Diagnosis Date  . History of allergic rhinitis   . History of asthma     mild  . History of kyphosis     Shueremann's Kyphosis    Past Surgical History  Procedure Laterality Date  . Elbow fracture surgery  05/1998  . Spinal fusion  2009    sx for shueremann's kyphosis    Current Outpatient Prescriptions  Medication Sig Dispense Refill  . acetaminophen (TYLENOL) 500 MG tablet Take 1,000 mg by mouth every 6 (six) hours as needed for pain.     Marland Kitchen albuterol (PROVENTIL HFA;VENTOLIN HFA) 108 (90 BASE) MCG/ACT inhaler Inhale 2 puffs into the lungs every 4 (four) hours as needed for wheezing. 1 Inhaler 0  . HYDROcodone-acetaminophen (NORCO) 7.5-325 MG per tablet Take 1 tablet by mouth 3 (three) times daily. 15 tablet 0  . metoCLOPramide (REGLAN) 5 MG/5ML solution Take 10 mLs (10 mg total) by mouth once. 20 mL 0  . omeprazole (PRILOSEC) 20 MG capsule Take 1 capsule (20 mg total) by mouth daily. 90 capsule 3  . ondansetron (ZOFRAN) 4 MG tablet Take 1 tablet (4 mg total) by mouth every 6 (six) hours. 12 tablet 0  . ranitidine (ZANTAC) 150 MG capsule Take 150 mg by mouth daily as needed for heartburn.    . sucralfate  (CARAFATE) 1 G tablet Take 1 tablet (1 g total) by mouth 4 (four) times daily -  with meals and at bedtime. 120 tablet 0   No current facility-administered medications for this visit.    Allergies as of 09/12/2014 - Review Complete 09/12/2014  Allergen Reaction Noted  . Aspirin Nausea Only and Other (See Comments)   . Methylphenidate hcl  07/11/2007    Family History  Problem Relation Age of Onset  . Ulcers Maternal Grandmother   . Irritable bowel syndrome Mother   . Lung cancer Maternal Grandmother     History   Social History  . Marital Status: Single    Spouse Name: N/A  . Number of Children: N/A  . Years of Education: N/A   Occupational History  . [employer]    Social History Main Topics  . Smoking status: Current Every Day Smoker -- 0.50 packs/day    Types: Cigarettes  . Smokeless tobacco: Former Neurosurgeon    Quit date: 08/26/2008  . Alcohol Use: 1.8 oz/week    3 Cans of beer per week     Comment: occasionally  . Drug Use: No  . Sexual Activity: No   Other Topics Concern  . Not on file   Social History Narrative     Physical Exam: BP 108/66 mmHg  Pulse 72  Ht 6' (1.829 m)  Wt 167 lb (75.751 kg)  BMI 22.64 kg/m2 Constitutional: generally well-appearing Psychiatric: alert and oriented x3 Abdomen: soft, nontender, nondistended, no obvious ascites, no peritoneal signs, normal bowel sounds   Assessment and plan: 24 y.o. male with postprandial abdominal pain, weight loss  Unclear etiology here. There was some evidence supporting possible inflammatory bowel disease however that is equivocal. He was taking extreme amounts of NSAIDs and I suspected that was the cause of his GI problems however he has not taken any in 2 months and he is still having pain after eating and losing weight. I think we need to do further testing. He will have an abdominal ultrasound to check for gallstones. He will have repeat labs today including CBC, complete metabolic profile, sedimentation  rate and celiac sprue panel. If this is all negative then I would likely proceed with capsule endoscopy testing. If that is also negative then MRI of mesenteric angiogram, mesenteric vessels. I have refilled some medicines for her including proton pump inhibitor, Carafate and also he asked for narcotic pain medicines and I will give him 50 of them.   Rob Buntinganiel Jacobs, MD [facility] 09/12/2014, 1:52 PM

## 2014-09-12 NOTE — Patient Instructions (Addendum)
One of your biggest health concerns is your smoking.  This increases your risk for most cancers and serious cardiovascular diseases such as strokes, heart attacks.  You should try your best to stop.  If you need assistance, please contact your PCP or Smoking Cessation Class at [facility] [phone]) or [facility]Line (1-800-QUIT-NOW). You will be set up for an ultrasound for intermittent abdominal pains.  You have been scheduled for an abdominal ultrasound at [facility] (1st floor of hospital) on 09/19/14 at 830 am. Please arrive 15 minutes prior to your appointment for registration. Make certain not to have anything to eat or drink 6 hours prior to your appointment. Should you need to reschedule your appointment, please contact radiology at [phone]. This test typically takes about 30 minutes to perform. If normal, then capsule endoscopy (for thickened small bowel on CT, ? IBD). If normal, then MRAngiogram of mesenteric vessels for fear of eating, abd pain after eating. Labs today (already entered, also add celiac panel). Refills of omeprazole (40mg  pill, one pill once daily), (sucralfate 4 daily), (hydrocodone 1 pill every 4-6 hours as needed, disp 50).

## 2014-09-19 ENCOUNTER — Ambulatory Visit (HOSPITAL_COMMUNITY)
Admission: RE | Admit: 2014-09-19 | Discharge: 2014-09-19 | Disposition: A | Discharge status: Home / Self Care | Payer: BLUE CROSS/BLUE SHIELD | Source: Ambulatory Visit | Attending: Gastroenterology | Admitting: Gastroenterology

## 2014-09-19 DIAGNOSIS — R109 Unspecified abdominal pain: Secondary | ICD-10-CM | POA: Insufficient documentation

## 2014-09-19 DIAGNOSIS — R110 Nausea: Secondary | ICD-10-CM | POA: Diagnosis not present

## 2014-09-23 ENCOUNTER — Other Ambulatory Visit: Payer: Self-pay

## 2014-09-23 DIAGNOSIS — K529 Noninfective gastroenteritis and colitis, unspecified: Secondary | ICD-10-CM

## 2014-09-23 DIAGNOSIS — R109 Unspecified abdominal pain: Secondary | ICD-10-CM

## 2014-09-23 DIAGNOSIS — K50819 Crohn's disease of both small and large intestine with unspecified complications: Secondary | ICD-10-CM

## 2014-09-29 ENCOUNTER — Other Ambulatory Visit: Payer: Self-pay

## 2014-09-29 ENCOUNTER — Telehealth: Payer: Self-pay | Admitting: Gastroenterology

## 2014-09-29 MED ORDER — HYOSCYAMINE SULFATE ER 0.375 MG PO TBCR: 1.0000 | EXTENDED_RELEASE_TABLET | Freq: Two times a day (BID) | ORAL | Status: DC | PRN

## 2014-09-29 NOTE — Telephone Encounter (Signed)
Doc of the Day This is a patient of Dr Christella Hartigan. Complains of abdominal cramps and diarrhea. Stopped the Carafate in preparation for Capsule endoscopy. Is it okay to give him Hyomax or Bentyl? He already has a prescription for Vicodin. Thanks. Assessment and plan: 24 y.o. male with postprandial abdominal pain, weight loss Unclear etiology here. There was some evidence supporting possible inflammatory bowel disease however that is equivocal. He was taking extreme amounts of NSAIDs and I suspected that was the cause of his GI problems however he has not taken any in 2 months and he is still having pain after eating and losing weight. I think we need to do further testing. He will have an abdominal ultrasound to check for gallstones. He will have repeat labs today including CBC, complete metabolic profile, sedimentation rate and celiac sprue panel. If this is all negative then I would likely proceed with capsule endoscopy testing. If that is also negative then MRI of mesenteric angiogram, mesenteric vessels. I have refilled some medicines for her including proton pump inhibitor, Carafate and also he asked for narcotic pain medicines and I will give him 50 of them.

## 2014-09-29 NOTE — Telephone Encounter (Signed)
Medication escribed to [facility]. Message left for the patient on his voicemail.

## 2014-09-29 NOTE — Telephone Encounter (Signed)
Try hyomax 0.375 mg twice a day when necessary

## 2014-10-03 ENCOUNTER — Ambulatory Visit (INDEPENDENT_AMBULATORY_CARE_PROVIDER_SITE_OTHER): Payer: BLUE CROSS/BLUE SHIELD | Admitting: Internal Medicine

## 2014-10-03 DIAGNOSIS — R933 Abnormal findings on diagnostic imaging of other parts of digestive tract: Secondary | ICD-10-CM | POA: Diagnosis not present

## 2014-10-03 DIAGNOSIS — R1084 Generalized abdominal pain: Secondary | ICD-10-CM

## 2014-10-03 NOTE — Progress Notes (Signed)
Patient here for capsule endoscopy.Patient has prepped and has not eaten this AM.Tolerated procedure. Verbalizes understanding of written and verbal instructions. Swallowed capsule without difficulty.  Capsule lot [id number] 10 Expires 2017-07

## 2014-10-05 ENCOUNTER — Other Ambulatory Visit: Payer: Self-pay | Admitting: Gastroenterology

## 2014-10-06 ENCOUNTER — Telehealth: Payer: Self-pay | Admitting: Gastroenterology

## 2014-10-06 NOTE — Telephone Encounter (Signed)
Pt advised to call PCP for back spasms, was scheduled to see Dr Christella Hartigan to discuss symptoms

## 2014-10-08 ENCOUNTER — Encounter: Payer: Self-pay | Admitting: Gastroenterology

## 2014-10-10 ENCOUNTER — Emergency Department (HOSPITAL_COMMUNITY): Payer: BLUE CROSS/BLUE SHIELD

## 2014-10-10 ENCOUNTER — Emergency Department (HOSPITAL_COMMUNITY)
Admission: EM | Admit: 2014-10-10 | Discharge: 2014-10-10 | Disposition: A | Discharge status: Home / Self Care | Payer: BLUE CROSS/BLUE SHIELD | Attending: Emergency Medicine | Admitting: Emergency Medicine

## 2014-10-10 ENCOUNTER — Encounter (HOSPITAL_COMMUNITY): Payer: Self-pay | Admitting: Emergency Medicine

## 2014-10-10 ENCOUNTER — Telehealth: Payer: Self-pay | Admitting: *Deleted

## 2014-10-10 DIAGNOSIS — Z8739 Personal history of other diseases of the musculoskeletal system and connective tissue: Secondary | ICD-10-CM | POA: Insufficient documentation

## 2014-10-10 DIAGNOSIS — Z981 Arthrodesis status: Secondary | ICD-10-CM | POA: Insufficient documentation

## 2014-10-10 DIAGNOSIS — Z720 Tobacco use: Secondary | ICD-10-CM | POA: Insufficient documentation

## 2014-10-10 DIAGNOSIS — R634 Abnormal weight loss: Secondary | ICD-10-CM | POA: Diagnosis not present

## 2014-10-10 DIAGNOSIS — Z8709 Personal history of other diseases of the respiratory system: Secondary | ICD-10-CM | POA: Insufficient documentation

## 2014-10-10 DIAGNOSIS — G8929 Other chronic pain: Secondary | ICD-10-CM

## 2014-10-10 DIAGNOSIS — R112 Nausea with vomiting, unspecified: Secondary | ICD-10-CM | POA: Insufficient documentation

## 2014-10-10 DIAGNOSIS — J4520 Mild intermittent asthma, uncomplicated: Secondary | ICD-10-CM | POA: Diagnosis not present

## 2014-10-10 DIAGNOSIS — R52 Pain, unspecified: Secondary | ICD-10-CM

## 2014-10-10 DIAGNOSIS — Z79899 Other long term (current) drug therapy: Secondary | ICD-10-CM | POA: Diagnosis not present

## 2014-10-10 DIAGNOSIS — R197 Diarrhea, unspecified: Secondary | ICD-10-CM | POA: Insufficient documentation

## 2014-10-10 DIAGNOSIS — R1033 Periumbilical pain: Secondary | ICD-10-CM | POA: Insufficient documentation

## 2014-10-10 DIAGNOSIS — R109 Unspecified abdominal pain: Secondary | ICD-10-CM

## 2014-10-10 MED ORDER — HYDROCODONE-ACETAMINOPHEN 7.5-325 MG PO TABS: 1.0000 | ORAL_TABLET | Freq: Four times a day (QID) | ORAL | Status: DC | PRN

## 2014-10-10 MED ORDER — HYDROCODONE-ACETAMINOPHEN 5-325 MG PO TABS
2.0000 | ORAL_TABLET | Freq: Once | ORAL | Status: AC
  Administered 2014-10-10: 2 via ORAL
  Filled 2014-10-10: qty 2

## 2014-10-10 NOTE — ED Notes (Signed)
Pt reports abd pain since February. Is being worked up with GI for possible reason for chronic abd pain. Had a camera pill a week ago and is unsure if camera passed yet. Began to have an acute flare-up of lower abd pain at 0300 this am with an episode of emesis.

## 2014-10-10 NOTE — Telephone Encounter (Signed)
-----   Message from Daphine Deutscheregina N Laqueta Bonaventura, RN sent at 10/03/2014 10:48 AM EDT ----- Call patient to see if he passed capsule.

## 2014-10-10 NOTE — Telephone Encounter (Signed)
Spoke with patient and he states he had an xray in ED and the capsule has passed.

## 2014-10-10 NOTE — ED Provider Notes (Signed)
CSN: [id number]     Arrival date & time 10/10/14  0711 History   First MD Initiated Contact with Patient 10/10/14 0725     Chief Complaint  Patient presents with  . Abdominal Pain     (Consider location/radiation/quality/duration/timing/severity/associated sxs/prior Treatment) HPI Complains of abdominal pain, periumbilical intermittent since January 2016. He had a flareup of similar pain beginning 1:30 AM today He gets similar pain approximately 3 times per week. Pain is exacerbated by eating greasy foods. Relieved by taking Norco .He reports having eaten greasy [place] food 7 PM yesterday which he is confident cause this particular flareup of pain. Last bowel movement 6 AM today, diarrhea. No blood per rectum, no fever. He treated himself with Zofran with relief of nausea. Patient normally takes Norco, 7.5 for the pain however he ran out of pain medicine 3 days ago. No other associated symptoms. Patient recently swallowed capsule for virtual endoscopy, he is uncertain if he passed capsule yet Past Medical History  Diagnosis Date  . History of allergic rhinitis   . History of asthma     mild  . History of kyphosis     Shueremann's Kyphosis   Past Surgical History  Procedure Laterality Date  . Elbow fracture surgery  05/1998  . Spinal fusion  2009    sx for shueremann's kyphosis   Family History  Problem Relation Age of Onset  . Ulcers Maternal Grandmother   . Irritable bowel syndrome Mother   . Lung cancer Maternal Grandmother    History  Substance Use Topics  . Smoking status: Current Every Day Smoker -- 0.50 packs/day    Types: Cigarettes  . Smokeless tobacco: Former Neurosurgeon    Quit date: 08/26/2008  . Alcohol Use: 1.8 oz/week    3 Cans of beer per week     Comment: occasionally    Review of Systems  Constitutional: Positive for unexpected weight change.       60 pound weight loss since January 2016  Gastrointestinal: Positive for nausea, vomiting, abdominal pain and  diarrhea.  All other systems reviewed and are negative.     Allergies  Aspirin and Methylphenidate hcl  Home Medications   Prior to Admission medications   Medication Sig Start Date End Date Taking? Authorizing Provider  acetaminophen (TYLENOL) 500 MG tablet Take 1,000 mg by mouth every 6 (six) hours as needed for pain.     Historical Provider, MD  albuterol (PROVENTIL HFA;VENTOLIN HFA) 108 (90 BASE) MCG/ACT inhaler Inhale 2 puffs into the lungs every 4 (four) hours as needed for wheezing. 06/20/14   Joaquim Nam, MD  HYDROcodone-acetaminophen (NORCO) 7.5-325 MG per tablet Take 1 tablet by mouth every 4 (four) hours as needed for moderate pain. 09/12/14   Rachael Fee, MD  Hyoscyamine Sulfate 0.375 MG TBCR Take 1 tablet (0.375 mg total) by mouth 2 (two) times daily as needed. 09/29/14   Louis Meckel, MD  metoCLOPramide (REGLAN) 5 MG/5ML solution Take 10 mLs (10 mg total) by mouth once. 07/03/14   Lori P Hvozdovic, PA-C  omeprazole (PRILOSEC) 40 MG capsule Take 1 capsule (40 mg total) by mouth daily. 09/12/14   Rachael Fee, MD  ondansetron (ZOFRAN) 4 MG tablet Take 1 tablet (4 mg total) by mouth every 6 (six) hours. 04/22/14   Roxy Horseman, PA-C  ranitidine (ZANTAC) 150 MG capsule Take 150 mg by mouth daily as needed for heartburn.    Historical Provider, MD  sucralfate (CARAFATE) 1 G tablet Take 1  tablet (1 g total) by mouth 4 (four) times daily. 09/12/14   Rachael Feeaniel P Jacobs, MD  sucralfate (CARAFATE) 1 G tablet TAKE 1 TABLET (1 G TOTAL) BY MOUTH 4 (FOUR) TIMES DAILY - WITH MEALS AND AT BEDTIME. 10/06/14   Rachael Feeaniel P Jacobs, MD   BP 137/84 mmHg  Pulse 56  Temp(Src) 98 F (36.7 C) (Oral)  Resp 17  SpO2 99% Physical Exam  Constitutional: He appears well-developed and well-nourished.  HENT:  Head: Normocephalic and atraumatic.  Eyes: Conjunctivae are normal. Pupils are equal, round, and reactive to light.  Neck: Neck supple. No tracheal deviation present. No thyromegaly present.   Cardiovascular: Normal rate and regular rhythm.   No murmur heard. Pulmonary/Chest: Effort normal and breath sounds normal.  Abdominal: Soft. Bowel sounds are normal. He exhibits no distension and no mass. There is tenderness. There is no rebound and no guarding.  periUmbilical tenderness  Genitourinary: Penis normal.  Musculoskeletal: Normal range of motion. He exhibits no edema or tenderness.  Neurological: He is alert. Coordination normal.  Skin: Skin is warm and dry. No rash noted.  Psychiatric: He has a normal mood and affect.  Nursing note and vitals reviewed.   ED Course  Procedures (including critical care time) Labs Review Labs Reviewed - No data to display  Imaging Review No results found.   EKG Interpretation None     x-rays viewed by me. Results for orders placed or performed in visit on 07/03/14  CBC with Differential/Platelet  Result Value Ref Range   WBC 8.1 4.0 - 10.5 K/uL   RBC 5.28 4.22 - 5.81 Mil/uL   Hemoglobin 15.6 13.0 - 17.0 g/dL   HCT 16.145.5 09.639.0 - 04.552.0 %   MCV 86.3 78.0 - 100.0 fl   MCHC 34.3 30.0 - 36.0 g/dL   RDW 40.912.8 81.111.5 - 91.415.5 %   Platelets 308.0 150.0 - 400.0 K/uL   Neutrophils Relative % 64.4 43.0 - 77.0 %   Lymphocytes Relative 24.5 12.0 - 46.0 %   Monocytes Relative 8.6 3.0 - 12.0 %   Eosinophils Relative 2.2 0.0 - 5.0 %   Basophils Relative 0.3 0.0 - 3.0 %   Neutro Abs 5.2 1.4 - 7.7 K/uL   Lymphs Abs 2.0 0.7 - 4.0 K/uL   Monocytes Absolute 0.7 0.1 - 1.0 K/uL   Eosinophils Absolute 0.2 0.0 - 0.7 K/uL   Basophils Absolute 0.0 0.0 - 0.1 K/uL  Comprehensive metabolic panel  Result Value Ref Range   Sodium 138 135 - 145 mEq/L   Potassium 4.7 3.5 - 5.1 mEq/L   Chloride 104 96 - 112 mEq/L   CO2 28 19 - 32 mEq/L   Glucose, Bld 101 (H) 70 - 99 mg/dL   BUN 9 6 - 23 mg/dL   Creatinine, Ser 7.820.83 0.40 - 1.50 mg/dL   Total Bilirubin 0.6 0.2 - 1.2 mg/dL   Alkaline Phosphatase 61 39 - 117 U/L   AST 18 0 - 37 U/L   ALT 15 0 - 53 U/L   Total  Protein 8.0 6.0 - 8.3 g/dL   Albumin 4.8 3.5 - 5.2 g/dL   Calcium 95.610.0 8.4 - 21.310.5 mg/dL   GFR 086.57122.22 >84.69>60.00 mL/min  Amylase  Result Value Ref Range   Amylase 22 (L) 27 - 131 U/L  Lipase  Result Value Ref Range   Lipase 17.0 11.0 - 59.0 U/L  TSH  Result Value Ref Range   TSH 1.12 0.35 - 4.50 uIU/mL  Sedimentation rate  Result Value Ref Range   Sed Rate 6 0 - 22 mm/hr  IgA  Result Value Ref Range   IgA 196 68 - 378 mg/dL  Tissue transglutaminase, IgA  Result Value Ref Range   Tissue Transglutaminase Ab, IgA 1 <4 U/mL  IBD Expanded Panel  Result Value Ref Range   gASCA 12 0 - 50 units   ACCA 38 0 - 90 units   ALCA 55 0 - 60 units   AMCA 120 (H) 0 - 100 units   Atypical pANCA Negative Negative   Comments Comment (A)    US Abdomen Complete  09/19/2014   CLINICAL DATA:  Abdominal pain.  Nausea.  EXAM: ULTRASOUND ABDOMEN COMPLETE  COMPARISON:  CT 04/22/2014.  FINDINGS: Gallbladder: No gallstones or wall thickening visualized. No sonographic Murphy sign noted.  Common bile duct: Diameter: 1.0 mm  Liver: No focal lesion identified. Within normal limits in parenchymal echogenicity.  IVC: No abnormality visualized.  Pancreas: Visualized portion unremarkable.  Spleen: Size and appearance within normal limits.  Right Kidney: Length: 11.1 cm. Echogenicity within normal limits. No mass or hydronephrosis visualized.  Left Kidney: Length: 11.6 cm. Echogenicity within normal limits. No mass or hydronephrosis visualized.  Abdominal aorta: No aneurysm visualized.  Other findings: None.  IMPRESSION: No acute or focal abnormality.   Electronically Signed   By: Maisie Fus  Register   On: 09/19/2014 09:15   Dg Abd 2 Views  10/10/2014   CLINICAL DATA:  Abdominal pain for 4 months. Camera pill administered last week. Acute flare of abdominal pain today. Initial encounter.  EXAM: ABDOMEN - 2 VIEW  COMPARISON:  Abdominal pelvic CT 04/22/2014.  FINDINGS: The bowel gas pattern is normal. There is no free  intraperitoneal air. No camera pill or other foreign body demonstrated. The bones appear unchanged status post thoracolumbar rod fusion.  IMPRESSION: No acute abdominal findings.  No evidence of retained camera pill.   Electronically Signed   By: Carey Bullocks M.D.   On: 10/10/2014 08:04    8:30 AM pain improved after treatment with Norco MDM  Spoke with Willette Cluster plan prescription Norco 20 tablets. Office to call patient on 10/14/2014 for close follow-up. Patient should not get opiates from multiple providers Diagnosis chronic abdominal pain  Final diagnoses:  None        Doug Sou, MD 10/10/14 [phone]

## 2014-10-10 NOTE — Discharge Instructions (Signed)
Dr. Birdena CrandallJacobs's office will call you on Tuesday, 10/14/2014 to arrange for close follow-up. If you don't hear from office by afternoon of July 5, call the office and explained that Dr.Navdeep Halt spoke wight Willette ClusterPaula Guenther about your case

## 2014-10-14 ENCOUNTER — Telehealth: Payer: Self-pay | Admitting: Gastroenterology

## 2014-10-15 ENCOUNTER — Telehealth: Payer: Self-pay | Admitting: Gastroenterology

## 2014-10-15 NOTE — Telephone Encounter (Signed)
Pt has been scheduled to see Gunnar Fusiaula to discuss abd pain and capsule results

## 2014-10-15 NOTE — Telephone Encounter (Signed)
Please call him. The capsule endoscopy was normal. Still no explanation for his abd pain, weight loss.  The next test I recommend is MRAngiogram of mesenteric vessels to check for signs of mesenteric ischemia.  Thanks

## 2014-10-15 NOTE — Telephone Encounter (Signed)
Pt has been scheduled to see Gunnar Fusiaula on 10/17/14 130 pm

## 2014-10-17 ENCOUNTER — Encounter: Payer: Self-pay | Admitting: Nurse Practitioner

## 2014-10-17 ENCOUNTER — Ambulatory Visit (INDEPENDENT_AMBULATORY_CARE_PROVIDER_SITE_OTHER): Payer: BLUE CROSS/BLUE SHIELD | Admitting: Nurse Practitioner

## 2014-10-17 VITALS — BP 108/72 | Ht 72.0 in | Wt 159.5 lb

## 2014-10-17 DIAGNOSIS — R1084 Generalized abdominal pain: Secondary | ICD-10-CM

## 2014-10-17 MED ORDER — HYOSCYAMINE SULFATE ER 0.375 MG PO TBCR: EXTENDED_RELEASE_TABLET | ORAL | Status: DC

## 2014-10-17 MED ORDER — HYDROCODONE-ACETAMINOPHEN 7.5-325 MG PO TABS: 1.0000 | ORAL_TABLET | Freq: Four times a day (QID) | ORAL | Status: DC | PRN

## 2014-10-17 NOTE — Progress Notes (Signed)
     History of Present Illness:   Patient is a 24 year old male who Dr. Christella HartiganJacobs has been evaluating for abdominal pain and weight loss. He had some small bowel thickening in the left upper quadrant on CTscan earlier this year. IBD serologies equivocal for IBD. EGD showed only mild gastritis. Colonoscopy in April of this year was normal to the terminal ileum. Patient had been taking a lot of NSAIDs. At his last appointment early June we ordered an ultrasound which was normal. Following that a capsule endoscopy study was obtained, it was normal. By our scales his weight is down another 8 pounds. Patient ran out of his pain meds, went back to ED 10/10/14. I spoke with ED that day, he was given #20 Norco.   Patient still having abdominal pain and vomiting, even today prior to arrival. Carafate helps some. Hyoscyamine helps a little as well but he is taking double the dose prescribed. Living on limited diet of fruit, chicken, water and a few other things. BMs normal.   Current Medications, Allergies, Past Medical History, Past Surgical History, Family History and Social History were reviewed in [facility] electronic medical record.  Physical Exam: General: Thin, white male in no acute distress Head: Normocephalic and atraumatic Eyes:  sclerae anicteric, conjunctiva pink  Ears: Normal auditory acuity Lungs: Clear throughout to auscultation Heart: Regular rate and rhythm Abdomen: Soft, non distended, non-tender. No masses, no hepatomegaly. Normal bowel sounds Musculoskeletal: Symmetrical with no gross deformities  Extremities: No edema  Neurological: Alert oriented x 4, grossly nonfocal Psychological:  Alert. Flat affect.  Assessment and Recommendations:  24 year old male with unexplained diffuse abdominal pain, vomiting and weight loss. CTscan earlier this year revealed enteritis in LUQ. IBD markers equivocal. .EGD, colonoscopy, ultrasound, and capsule endoscopy have all been unrevealing. Will  obtain MRAngio to evaluate for mesenteric ischemia.. Requesting refill on hyoscyamine and Hydrocodone (will give #30).

## 2014-10-17 NOTE — Patient Instructions (Addendum)
We sent a refill for the Hyoscyamine tablets to [facility] [address]. We have given you a signed printed prescription for the Hydrocodone tablets.  You have been scheduled for an MR/ Angiogram of Abdomen at [address], [facility] building on 10-31-2014. Your appointment time is  9:00 am . Please arrive at 8:45 am  to your appointment time for registration purposes. Please make certain not to have anything to eat after midnight.  In addition, if you have any metal in your body, have a pacemaker or defibrillator, please be sure to let your ordering physician know. This test typically takes 45 minutes to 1 hour to complete.

## 2014-10-19 ENCOUNTER — Encounter: Payer: Self-pay | Admitting: Nurse Practitioner

## 2014-10-19 DIAGNOSIS — R1084 Generalized abdominal pain: Secondary | ICD-10-CM | POA: Insufficient documentation

## 2014-10-20 NOTE — Progress Notes (Signed)
i agree with the above note, plan 

## 2014-10-27 ENCOUNTER — Emergency Department (HOSPITAL_COMMUNITY)
Admission: EM | Admit: 2014-10-27 | Discharge: 2014-10-27 | Disposition: A | Discharge status: Home / Self Care | Payer: BLUE CROSS/BLUE SHIELD | Attending: Emergency Medicine | Admitting: Emergency Medicine

## 2014-10-27 ENCOUNTER — Encounter (HOSPITAL_COMMUNITY): Payer: Self-pay | Admitting: Emergency Medicine

## 2014-10-27 DIAGNOSIS — R300 Dysuria: Secondary | ICD-10-CM | POA: Diagnosis not present

## 2014-10-27 DIAGNOSIS — R109 Unspecified abdominal pain: Secondary | ICD-10-CM | POA: Insufficient documentation

## 2014-10-27 DIAGNOSIS — Z79899 Other long term (current) drug therapy: Secondary | ICD-10-CM | POA: Diagnosis not present

## 2014-10-27 DIAGNOSIS — Z720 Tobacco use: Secondary | ICD-10-CM | POA: Diagnosis not present

## 2014-10-27 DIAGNOSIS — Z8619 Personal history of other infectious and parasitic diseases: Secondary | ICD-10-CM | POA: Diagnosis not present

## 2014-10-27 DIAGNOSIS — J45909 Unspecified asthma, uncomplicated: Secondary | ICD-10-CM | POA: Insufficient documentation

## 2014-10-27 DIAGNOSIS — R1032 Left lower quadrant pain: Secondary | ICD-10-CM | POA: Diagnosis present

## 2014-10-27 LAB — URINALYSIS, ROUTINE W REFLEX MICROSCOPIC
Bilirubin Urine: NEGATIVE
Glucose, UA: NEGATIVE mg/dL
Hgb urine dipstick: NEGATIVE
Ketones, ur: NEGATIVE mg/dL
Leukocytes, UA: NEGATIVE
Nitrite: NEGATIVE
PH: 7.5 (ref 5.0–8.0)
Protein, ur: NEGATIVE mg/dL
Specific Gravity, Urine: 1.016 (ref 1.005–1.030)
UROBILINOGEN UA: 0.2 mg/dL (ref 0.0–1.0)

## 2014-10-27 MED ORDER — ONDANSETRON 8 MG PO TBDP
8.0000 mg | ORAL_TABLET | Freq: Once | ORAL | Status: AC
  Administered 2014-10-27: 8 mg via ORAL
  Filled 2014-10-27: qty 1

## 2014-10-27 MED ORDER — ACETAMINOPHEN 325 MG PO TABS
650.0000 mg | ORAL_TABLET | Freq: Once | ORAL | Status: AC
  Administered 2014-10-27: 650 mg via ORAL
  Filled 2014-10-27: qty 2

## 2014-10-27 NOTE — ED Notes (Signed)
Pt reported have Lt quadrant pain that radiates to his back. Pt currently being followed via GI specialist. Pt n/v/d. Abd soft/nondistended but tender to palpate. BS (+) and active x4 quadrants.

## 2014-10-27 NOTE — ED Notes (Signed)
Heart rate lower than normal.  Reported this info to Derrick Roberts.

## 2014-10-27 NOTE — ED Notes (Signed)
Pt c/o lower abd pain that has been ongoing for 6 months but the pain started around 1030 this am.  Pt was watching tv and getting ready to go to work when the pain started. Pt denies n/v/d at this time.

## 2014-10-27 NOTE — ED Notes (Signed)
Awake. Verbally responsive. A/O x4. Resp even and unlabored. No audible adventitious breath sounds noted. ABC's intact.  

## 2014-10-27 NOTE — ED Notes (Signed)
Heart rate not within normal limits.  Blanche advised.

## 2014-10-27 NOTE — Discharge Instructions (Signed)

## 2014-10-27 NOTE — ED Provider Notes (Signed)
CSN: [id number]     Arrival date & time 10/27/14  1217 History   First MD Initiated Contact with Patient 10/27/14 1233     Chief Complaint  Patient presents with  . Abdominal Pain    Patient is a 24 y.o. male presenting with abdominal pain. The history is provided by the patient.  Abdominal Pain Pain location:  LLQ Pain quality: aching   Pain severity:  Moderate Onset quality:  Gradual Duration:  1 day Timing:  Constant Progression:  Unchanged Relieved by:  Nothing Worsened by:  Movement and palpation Associated symptoms: dysuria   Associated symptoms: no chest pain, no constipation, no diarrhea, no fever, no hematemesis, no hematochezia and no vomiting   Risk factors: has not had multiple surgeries     Past Medical History  Diagnosis Date  . History of allergic rhinitis   . History of asthma     mild  . History of kyphosis     Shueremann's Kyphosis   Past Surgical History  Procedure Laterality Date  . Elbow fracture surgery  05/1998  . Spinal fusion  2009    sx for shueremann's kyphosis   Family History  Problem Relation Age of Onset  . Ulcers Maternal Grandmother   . Irritable bowel syndrome Mother   . Lung cancer Maternal Grandmother    History  Substance Use Topics  . Smoking status: Current Every Day Smoker -- 0.50 packs/day    Types: Cigarettes  . Smokeless tobacco: Former Neurosurgeon    Quit date: 08/26/2008  . Alcohol Use: No    Review of Systems  Constitutional: Negative for fever.  Cardiovascular: Negative for chest pain.  Gastrointestinal: Positive for abdominal pain. Negative for vomiting, diarrhea, constipation, hematochezia and hematemesis.  Genitourinary: Positive for dysuria. Negative for testicular pain.  All other systems reviewed and are negative.     Allergies  Aspirin and Methylphenidate hcl  Home Medications   Prior to Admission medications   Medication Sig Start Date End Date Taking? Authorizing Provider  acetaminophen (TYLENOL) 500  MG tablet Take 1,000 mg by mouth every 6 (six) hours as needed for pain.    Yes Historical Provider, MD  HYDROcodone-acetaminophen (NORCO) 7.5-325 MG per tablet Take 1 tablet by mouth every 6 (six) hours as needed for moderate pain. 10/17/14  Yes Meredith Pel, NP  Hyoscyamine Sulfate 0.375 MG TBCR Take 1 tab twice daily only. 10/17/14  Yes Meredith Pel, NP  Nutritional Supplements (NUTRITIONAL SHAKE PLUS PROTEIN PO) Take 1 Can by mouth daily.   Yes Historical Provider, MD  omeprazole (PRILOSEC) 40 MG capsule Take 1 capsule (40 mg total) by mouth daily. 09/12/14  Yes Rachael Fee, MD  ondansetron (ZOFRAN) 4 MG tablet Take 1 tablet (4 mg total) by mouth every 6 (six) hours. 04/22/14  Yes Roxy Horseman, PA-C  ranitidine (ZANTAC) 150 MG capsule Take 150 mg by mouth daily as needed for heartburn.   Yes Historical Provider, MD  sucralfate (CARAFATE) 1 G tablet Take 1 tablet (1 g total) by mouth 4 (four) times daily. 09/12/14  Yes Rachael Fee, MD  albuterol (PROVENTIL HFA;VENTOLIN HFA) 108 (90 BASE) MCG/ACT inhaler Inhale 2 puffs into the lungs every 4 (four) hours as needed for wheezing. Patient not taking: Reported on 10/27/2014 06/20/14   Joaquim Nam, MD   BP 119/72 mmHg  Pulse 57  Temp(Src) 98.4 F (36.9 C) (Oral)  Resp 18  SpO2 100% Physical Exam CONSTITUTIONAL: Well developed/well nourished HEAD: Normocephalic/atraumatic EYES: EOMI/PERRL  ENMT: Mucous membranes moist NECK: supple no meningeal signs SPINE/BACK:entire spine nontender CV: S1/S2 noted, no murmurs/rubs/gallops noted LUNGS: Lungs are clear to auscultation bilaterally, no apparent distress ABDOMEN: soft, nontender, no rebound or guarding, bowel sounds noted throughout abdomen GU:no cva tenderness NEURO: Pt is awake/alert/appropriate, moves all extremitiesx4.  No facial droop.   EXTREMITIES: pulses normal/equal, full ROM SKIN: warm, color normal PSYCH: no abnormalities of mood noted, alert and oriented to  situation  ED Course  Procedures (including critical care time) Labs Review Labs Reviewed  URINALYSIS, ROUTINE W REFLEX MICROSCOPIC (NOT AT [facility]) - Abnormal; Notable for the following:    APPearance CLOUDY (*)    All other components within normal limits    Pt admits he has this pain about every week for months He is known to GI and has f/u soon His abd exam is unremarkable Stable for d/c home  MDM   Final diagnoses:  None    Nursing notes including past medical history and social history reviewed and considered in documentation Labs/vital reviewed myself and considered during evaluation Previous records reviewed and considered     Zadie Rhineonald Casey Maxfield, MD 10/27/14 1348

## 2014-10-28 ENCOUNTER — Emergency Department (HOSPITAL_COMMUNITY)
Admission: EM | Admit: 2014-10-28 | Discharge: 2014-10-28 | Disposition: A | Discharge status: Home / Self Care | Payer: BLUE CROSS/BLUE SHIELD | Attending: Physician Assistant | Admitting: Physician Assistant

## 2014-10-28 ENCOUNTER — Emergency Department (HOSPITAL_COMMUNITY): Payer: BLUE CROSS/BLUE SHIELD

## 2014-10-28 ENCOUNTER — Telehealth: Payer: Self-pay | Admitting: Gastroenterology

## 2014-10-28 ENCOUNTER — Encounter (HOSPITAL_COMMUNITY): Payer: Self-pay

## 2014-10-28 DIAGNOSIS — Z79899 Other long term (current) drug therapy: Secondary | ICD-10-CM | POA: Insufficient documentation

## 2014-10-28 DIAGNOSIS — Z8739 Personal history of other diseases of the musculoskeletal system and connective tissue: Secondary | ICD-10-CM | POA: Insufficient documentation

## 2014-10-28 DIAGNOSIS — R110 Nausea: Secondary | ICD-10-CM | POA: Diagnosis not present

## 2014-10-28 DIAGNOSIS — R1084 Generalized abdominal pain: Secondary | ICD-10-CM | POA: Insufficient documentation

## 2014-10-28 DIAGNOSIS — J45909 Unspecified asthma, uncomplicated: Secondary | ICD-10-CM | POA: Insufficient documentation

## 2014-10-28 DIAGNOSIS — Z720 Tobacco use: Secondary | ICD-10-CM | POA: Insufficient documentation

## 2014-10-28 DIAGNOSIS — R109 Unspecified abdominal pain: Secondary | ICD-10-CM | POA: Diagnosis present

## 2014-10-28 LAB — HEPATIC FUNCTION PANEL
ALK PHOS: 56 U/L (ref 38–126)
ALT: 44 U/L (ref 17–63)
AST: 30 U/L (ref 15–41)
Albumin: 4.6 g/dL (ref 3.5–5.0)
Bilirubin, Direct: <0.1 mg/dL — ABNORMAL LOW (ref 0.1–0.5)
TOTAL PROTEIN: 7.5 g/dL (ref 6.5–8.1)
Total Bilirubin: 0.3 mg/dL (ref 0.3–1.2)

## 2014-10-28 LAB — CBC WITH DIFFERENTIAL/PLATELET
BASOS ABS: 0 10*3/uL (ref 0.0–0.1)
Basophils Relative: 0 % (ref 0–1)
EOS PCT: 1 % (ref 0–5)
Eosinophils Absolute: 0.1 10*3/uL (ref 0.0–0.7)
HCT: 43.4 % (ref 39.0–52.0)
HEMOGLOBIN: 14.6 g/dL (ref 13.0–17.0)
LYMPHS PCT: 10 % — AB (ref 12–46)
Lymphs Abs: 1.2 10*3/uL (ref 0.7–4.0)
MCH: 29.7 pg (ref 26.0–34.0)
MCHC: 33.6 g/dL (ref 30.0–36.0)
MCV: 88.2 fL (ref 78.0–100.0)
Monocytes Absolute: 0.6 10*3/uL (ref 0.1–1.0)
Monocytes Relative: 5 % (ref 3–12)
NEUTROS PCT: 84 % — AB (ref 43–77)
Neutro Abs: 10.9 10*3/uL — ABNORMAL HIGH (ref 1.7–7.7)
PLATELETS: 239 10*3/uL (ref 150–400)
RBC: 4.92 MIL/uL (ref 4.22–5.81)
RDW: 12.4 % (ref 11.5–15.5)
WBC: 12.8 10*3/uL — ABNORMAL HIGH (ref 4.0–10.5)

## 2014-10-28 LAB — I-STAT CG4 LACTIC ACID, ED: Lactic Acid, Venous: 1.75 mmol/L (ref 0.5–2.0)

## 2014-10-28 LAB — BASIC METABOLIC PANEL
Anion gap: 6 (ref 5–15)
BUN: 10 mg/dL (ref 6–20)
CALCIUM: 9.2 mg/dL (ref 8.9–10.3)
CO2: 27 mmol/L (ref 22–32)
Chloride: 107 mmol/L (ref 101–111)
Creatinine, Ser: 0.75 mg/dL (ref 0.61–1.24)
GFR calc Af Amer: >60 mL/min (ref >60)
GFR calc non Af Amer: >60 mL/min (ref >60)
GLUCOSE: 119 mg/dL — AB (ref 65–99)
POTASSIUM: 3.6 mmol/L (ref 3.5–5.1)
Sodium: 140 mmol/L (ref 135–145)

## 2014-10-28 LAB — LIPASE, BLOOD: Lipase: 15 U/L — ABNORMAL LOW (ref 22–51)

## 2014-10-28 MED ORDER — SODIUM CHLORIDE 0.9 % IV SOLN
INTRAVENOUS | Status: DC
  Administered 2014-10-28: 14:00:00 via INTRAVENOUS

## 2014-10-28 MED ORDER — IOHEXOL 300 MG/ML  SOLN
100.0000 mL | Freq: Once | INTRAMUSCULAR | Status: AC | PRN
  Administered 2014-10-28: 100 mL via INTRAVENOUS

## 2014-10-28 MED ORDER — LORAZEPAM 2 MG/ML IJ SOLN
1.0000 mg | Freq: Once | INTRAMUSCULAR | Status: AC
  Administered 2014-10-28: 1 mg via INTRAVENOUS
  Filled 2014-10-28: qty 1

## 2014-10-28 MED ORDER — HYDROCODONE-ACETAMINOPHEN 5-325 MG PO TABS
1.0000 | ORAL_TABLET | Freq: Once | ORAL | Status: AC
  Administered 2014-10-28: 1 via ORAL
  Filled 2014-10-28: qty 1

## 2014-10-28 MED ORDER — MORPHINE SULFATE 4 MG/ML IJ SOLN: 4.0000 mg | Freq: Once | INTRAMUSCULAR | Status: DC

## 2014-10-28 MED ORDER — HYDROCODONE-ACETAMINOPHEN 5-325 MG PO TABS: 1.0000 | ORAL_TABLET | ORAL | Status: DC | PRN

## 2014-10-28 MED ORDER — IOHEXOL 300 MG/ML  SOLN
50.0000 mL | Freq: Once | INTRAMUSCULAR | Status: AC | PRN
  Administered 2014-10-28: 50 mL via ORAL

## 2014-10-28 NOTE — ED Provider Notes (Addendum)
CSN: [id number]     Arrival date & time 10/28/14  1242 History   First MD Initiated Contact with Patient 10/28/14 1249     Chief Complaint  Patient presents with  . Abdominal Pain     (Consider location/radiation/quality/duration/timing/severity/associated sxs/prior Treatment) Patient is a 24 y.o. male presenting with abdominal pain.  Abdominal Pain Associated symptoms: nausea   Associated symptoms: no chest pain, no cough, no diarrhea, no dysuria, no fever and no shortness of breath   Patient is a 24 year old male presenting with abdominal pain. He has had length work up for this with GI service inclduing, CT (> 6 months ago), endoscopy, colonoscopy, capusule study.  Here with mom.  Both are very upset regarding inability to find diagnosis. 10 pound weight loss, not eating. No vomiting, no diarrhea, no bleeding per rectum.   Past Medical History  Diagnosis Date  . History of allergic rhinitis   . History of asthma     mild  . History of kyphosis     Shueremann's Kyphosis   Past Surgical History  Procedure Laterality Date  . Elbow fracture surgery  05/1998  . Spinal fusion  2009    sx for shueremann's kyphosis   Family History  Problem Relation Age of Onset  . Ulcers Maternal Grandmother   . Irritable bowel syndrome Mother   . Lung cancer Maternal Grandmother    History  Substance Use Topics  . Smoking status: Current Every Day Smoker -- 0.50 packs/day    Types: Cigarettes  . Smokeless tobacco: Former NeurosurgeonUser    Quit date: 08/26/2008  . Alcohol Use: No    Review of Systems  Constitutional: Negative for fever and activity change.  HENT: Negative for drooling and hearing loss.   Eyes: Negative for discharge and redness.  Respiratory: Negative for cough and shortness of breath.   Cardiovascular: Negative for chest pain.  Gastrointestinal: Positive for nausea and abdominal pain. Negative for diarrhea, blood in stool and rectal pain.  Genitourinary: Negative for dysuria  and urgency.  Musculoskeletal: Negative for arthralgias.  Allergic/Immunologic: Negative for immunocompromised state.  Neurological: Negative for seizures and speech difficulty.  Psychiatric/Behavioral: Negative for behavioral problems and agitation.  All other systems reviewed and are negative.     Allergies  Aspirin and Methylphenidate hcl  Home Medications   Prior to Admission medications   Medication Sig Start Date End Date Taking? Authorizing Provider  acetaminophen (TYLENOL) 500 MG tablet Take 1,000 mg by mouth every 6 (six) hours as needed for pain.    Yes Historical Provider, MD  HYDROcodone-acetaminophen (NORCO) 7.5-325 MG per tablet Take 1 tablet by mouth every 6 (six) hours as needed for moderate pain. 10/17/14  Yes Meredith PelPaula M Guenther, NP  Hyoscyamine Sulfate 0.375 MG TBCR Take 1 tab twice daily only. Patient taking differently: Take 0.375 mg by mouth 2 (two) times daily.  10/17/14  Yes Meredith PelPaula M Guenther, NP  Nutritional Supplements (NUTRITIONAL SHAKE PLUS PROTEIN PO) Take 1 Can by mouth daily.   Yes Historical Provider, MD  omeprazole (PRILOSEC) 40 MG capsule Take 1 capsule (40 mg total) by mouth daily. 09/12/14  Yes Rachael Feeaniel P Jacobs, MD  ondansetron (ZOFRAN) 4 MG tablet Take 1 tablet (4 mg total) by mouth every 6 (six) hours. Patient taking differently: Take 4 mg by mouth every 6 (six) hours as needed for nausea or vomiting.  04/22/14  Yes Roxy Horsemanobert Browning, PA-C  ranitidine (ZANTAC) 150 MG capsule Take 150 mg by mouth daily as needed  for heartburn.   Yes Historical Provider, MD  sucralfate (CARAFATE) 1 G tablet Take 1 tablet (1 g total) by mouth 4 (four) times daily. 09/12/14  Yes Rachael Fee, MD  albuterol (PROVENTIL HFA;VENTOLIN HFA) 108 (90 BASE) MCG/ACT inhaler Inhale 2 puffs into the lungs every 4 (four) hours as needed for wheezing. Patient not taking: Reported on 10/27/2014 06/20/14   Joaquim Nam, MD   BP 137/93 mmHg  Pulse 50  Temp(Src) 97.7 F (36.5 C) (Oral)  Resp 16   SpO2 99% Physical Exam  Constitutional: He is oriented to person, place, and time. He appears well-nourished.  HENT:  Head: Normocephalic.  Mouth/Throat: Oropharynx is clear and moist.  Eyes: Conjunctivae are normal.  Neck: No tracheal deviation present.  Cardiovascular: Normal rate.   Pulmonary/Chest: Effort normal. No stridor. No respiratory distress.  Abdominal: Soft. There is tenderness. There is no guarding.  Diffuse tenderness   Musculoskeletal: Normal range of motion. He exhibits no edema.  Neurological: He is oriented to person, place, and time. No cranial nerve deficit.  Skin: Skin is warm and dry. No rash noted. He is not diaphoretic.  Psychiatric:  anxious  Nursing note and vitals reviewed.   ED Course  Procedures (including critical care time) Labs Review Labs Reviewed - No data to display  Imaging Review No results found.   EKG Interpretation None      MDM   Final diagnoses:  None    Patietn is a 24 year old male presenting with abdomimal pain for the last several months. Extensive work up by GI. (capsule, EGD colonoscopy) SCheduled for MRI MRA this Friday. GI has been unable to find cause. Today this is same pain as usual.  No fevers.  Extreme tenderness to palpation.  Last CT was > 6 months ago.  Patient/mother centered discussion about whether to get another CT. Radiation exposure vs peace of mind from negative CT.  Patient and mother would like CT today. Ordered labs.  Vital signs normal.   Patient received ativan which helped his pain completely. Will give home narcotics  Do not want to prescribe narcotics since patient recently had prescription from GI. Will give short course since he states he has taken all of the pills he has at home.    Will still ecnourage follow up with GI.   Courteney Randall An, MD 10/28/14 1359  Courteney Randall An, MD 10/28/14 [phone]

## 2014-10-28 NOTE — Telephone Encounter (Signed)
Pt is being seen in the ED and will be having CT while there.  I spoke with Doug SouJessica Zehr and she has spoken with the ED staff.  Unless any concerns show up on the CT no consult will be needed today.  Pt should keep appt to follow up as scheduled with Dr Christella HartiganJacobs on 11/06/14

## 2014-10-28 NOTE — ED Notes (Signed)
MD at bedside. 

## 2014-10-28 NOTE — ED Notes (Signed)
Patient transported to CT 

## 2014-10-28 NOTE — ED Notes (Signed)
[facility] Expected date:  Expected time:  Means of arrival:  Comments: EMS- 24yo F, abdominal pain x 6 months

## 2014-10-28 NOTE — Discharge Instructions (Signed)

## 2014-10-28 NOTE — Telephone Encounter (Signed)
Pt is having a CT today and MRI on 10/31/14.  I will try and reach pt later today after CT appt.

## 2014-10-28 NOTE — ED Notes (Signed)
Per EMS- Pt c/o of generalized stomach pain x 6 months. [facility]. Pt states 7.5/325mg  for pain with good relief. Pt states has appt with GI this week however pain was too severe to wait. Denies N/V/D and fever.

## 2014-10-28 NOTE — ED Notes (Signed)
Family at bedside. 

## 2014-10-29 ENCOUNTER — Telehealth: Payer: Self-pay | Admitting: Gastroenterology

## 2014-10-29 NOTE — Telephone Encounter (Signed)
Patient wants to know if we will refill his Tramadol that he has been receiving from his PCP. I informed patient that he needs to contact his PCP for refills since we have not prescribed this medication. Pt states he has been to the ER twice this week and wants to know if we will not refill his Tramadol then can we refill his hydrocodone. Gunnar Fusiaula did refill this medicine on 10/17/14 and gave him # 30. Informed patient that I will ask Dr. Christella HartiganJacobs. He does have an appt with you on 11/05/14. Please advise.

## 2014-10-29 NOTE — Telephone Encounter (Signed)
Ok, thanks.

## 2014-10-31 ENCOUNTER — Ambulatory Visit (HOSPITAL_COMMUNITY)
Admission: RE | Admit: 2014-10-31 | Discharge: 2014-10-31 | Disposition: A | Discharge status: Home / Self Care | Payer: BLUE CROSS/BLUE SHIELD | Source: Ambulatory Visit | Attending: Nurse Practitioner | Admitting: Nurse Practitioner

## 2014-10-31 DIAGNOSIS — R109 Unspecified abdominal pain: Secondary | ICD-10-CM | POA: Diagnosis not present

## 2014-10-31 DIAGNOSIS — R1084 Generalized abdominal pain: Secondary | ICD-10-CM

## 2014-10-31 MED ORDER — GADOBENATE DIMEGLUMINE 529 MG/ML IV SOLN
15.0000 mL | Freq: Once | INTRAVENOUS | Status: AC | PRN
  Administered 2014-10-31: 14 mL via INTRAVENOUS

## 2014-10-31 NOTE — Telephone Encounter (Signed)
HE has had numerous tests without any clear etiology and I am not comfortable prescribing narcotics at this point.

## 2014-10-31 NOTE — Telephone Encounter (Signed)
Informed patient of Dr. Larae Grooms recommendations. Pt verbalized understanding.

## 2014-11-05 ENCOUNTER — Ambulatory Visit (INDEPENDENT_AMBULATORY_CARE_PROVIDER_SITE_OTHER): Payer: BLUE CROSS/BLUE SHIELD | Admitting: Gastroenterology

## 2014-11-05 ENCOUNTER — Encounter: Payer: Self-pay | Admitting: Gastroenterology

## 2014-11-05 VITALS — BP 124/60 | HR 68 | Ht 72.0 in | Wt 158.0 lb

## 2014-11-05 DIAGNOSIS — K828 Other specified diseases of gallbladder: Secondary | ICD-10-CM | POA: Diagnosis not present

## 2014-11-05 MED ORDER — TRAMADOL HCL 50 MG PO TABS: 50.0000 mg | ORAL_TABLET | Freq: Four times a day (QID) | ORAL | Status: DC | PRN

## 2014-11-05 NOTE — Progress Notes (Signed)
Review of pertinent gastrointestinal problems: 1. abd pain, vomiting, ? enteritis on CT 04/2014 "mild small bowel thickening in LUQ that may reflect enteritis;" labs 06/2014 sed rate normal, CBC, cmet normal, IBD serologies equivocal for IBD, crohn's. EGD 07/2014 mild gastritis, biopseis neg for H. Pylori. colonoscopy 07/2014 normal to the terminal ileum. He was taking 4 alleve daily, 2 ibuprofen daily. ER visit 10/2014 for abd pain: WBC slightly elevated, otherwise amylase, CMET were normal. CT scan 10/2014 limited by metal beam effect, no obvious bowel issues however.  MR Angiogram 10/2014 was normal, no clear cause of his pains.  HPI: This is a very pleasant 24 year old man whom I last saw about 2 months ago.    Chief complaint is intermittent abdominal pains  Has intermittent sharp pains, can last up to 6-8 hours. These pains are severe. He often ends up in the emergency room. Associated with nausea, vomiting.  Will occur 2-3 times per week.  Only eats once a day, because pain can occur.  NO loose stools.  He has had extensive testing, see above. Essentially no explanation for his intermittent pains He is here with his mother today   Past Medical History  Diagnosis Date  . History of allergic rhinitis   . History of asthma     mild  . History of kyphosis     Shueremann's Kyphosis    Past Surgical History  Procedure Laterality Date  . Elbow fracture surgery  05/1998  . Spinal fusion  2009    sx for shueremann's kyphosis    Current Outpatient Prescriptions  Medication Sig Dispense Refill  . acetaminophen (TYLENOL) 500 MG tablet Take 1,000 mg by mouth every 6 (six) hours as needed for pain.     Marland Kitchen albuterol (PROVENTIL HFA;VENTOLIN HFA) 108 (90 BASE) MCG/ACT inhaler Inhale 2 puffs into the lungs every 4 (four) hours as needed for wheezing. 1 Inhaler 0  . HYDROcodone-acetaminophen (NORCO) 7.5-325 MG per tablet Take 1 tablet by mouth every 6 (six) hours as needed for moderate pain. 30  tablet 0  . HYDROcodone-acetaminophen (NORCO/VICODIN) 5-325 MG per tablet Take 1 tablet by mouth every 4 (four) hours as needed. 6 tablet 0  . Hyoscyamine Sulfate 0.375 MG TBCR Take 1 tab twice daily only. (Patient taking differently: Take 0.375 mg by mouth 2 (two) times daily. ) 60 tablet 1  . Nutritional Supplements (NUTRITIONAL SHAKE PLUS PROTEIN PO) Take 1 Can by mouth daily.    Marland Kitchen omeprazole (PRILOSEC) 40 MG capsule Take 1 capsule (40 mg total) by mouth daily. 30 capsule 6  . ondansetron (ZOFRAN) 4 MG tablet Take 1 tablet (4 mg total) by mouth every 6 (six) hours. (Patient taking differently: Take 4 mg by mouth every 6 (six) hours as needed for nausea or vomiting. ) 12 tablet 0  . ranitidine (ZANTAC) 150 MG capsule Take 150 mg by mouth daily as needed for heartburn.    . sucralfate (CARAFATE) 1 G tablet Take 1 tablet (1 g total) by mouth 4 (four) times daily. 120 tablet 6   No current facility-administered medications for this visit.    Allergies as of 11/05/2014 - Review Complete 11/05/2014  Allergen Reaction Noted  . Aspirin Nausea Only and Other (See Comments)   . Methylphenidate hcl  07/11/2007    Family History  Problem Relation Age of Onset  . Ulcers Maternal Grandmother   . Irritable bowel syndrome Mother   . Lung cancer Maternal Grandmother     History   Social History  .  Marital Status: Single    Spouse Name: N/A  . Number of Children: N/A  . Years of Education: N/A   Occupational History  . [employer]    Social History Main Topics  . Smoking status: Current Every Day Smoker -- 0.50 packs/day    Types: Cigarettes  . Smokeless tobacco: Former Neurosurgeon    Quit date: 08/26/2008  . Alcohol Use: No  . Drug Use: No  . Sexual Activity: No   Other Topics Concern  . Not on file   Social History Narrative     Physical Exam: BP 124/60 mmHg  Pulse 68  Ht 6' (1.829 m)  158 pounds Constitutional: generally well-appearing Psychiatric: alert and oriented x3 Abdomen: soft,  nontender, nondistended, no obvious ascites, no peritoneal signs, normal bowel sounds   Assessment and plan: 24 y.o. male with intermittent abdominal pains, severe  Still unclear etiology. Perhaps he has biliary dyskinesia. He says this runs in his family. We will arrange for HIDA scan to be performed with CCK injection to estimate gallbladder ejection fraction. If significant biliary dyskinesia is suspected then I will recommend cholecystectomy. He asked about pain medicines but he does not want narcotic pain medicines. He has had some relief from his serious back pains with tramadol and I'm happy to give him a prescription for that. 50 mg pills, he will take 1 pill 3-4 times daily as needed for pain.   Rob Bunting, MD [facility] 11/05/2014, 9:10 AM

## 2014-11-05 NOTE — Patient Instructions (Addendum)
HIDA scan for GB EF, biliary dyskinesia (expedited).  You have been scheduled for a HIDA scan at [facility] (1st floor) on 11/07/14. Please arrive 15 minutes prior to your scheduled appointment at  730 am. Make certain not to have anything to eat or drink at least 6 hours prior to your test. Should this appointment date or time not work well for you, please call radiology scheduling at [phone].   _____________________________________________________________________ Hepatobiliary (HIDA) scan is an imaging procedure used to diagnose problems in the liver, gallbladder and bile ducts. In the HIDA scan, a radioactive chemical or tracer is injected into a vein in your arm. The tracer is handled by the liver like bile. Bile is a fluid produced and excreted by your liver that helps your digestive system break down fats in the foods you eat. Bile is stored in your gallbladder and the gallbladder releases the bile when you eat a meal. A special nuclear medicine scanner (gamma camera) tracks the flow of the tracer from your liver into your gallbladder and small intestine.  During your HIDA scan  You'll be asked to change into a hospital gown before your HIDA scan begins. Your health care team will position you on a table, usually on your back. The radioactive tracer is then injected into a vein in your arm.The tracer travels through your bloodstream to your liver, where it's taken up by the bile-producing cells. The radioactive tracer travels with the bile from your liver into your gallbladder and through your bile ducts to your small intestine.You may feel some pressure while the radioactive tracer is injected into your vein. As you lie on the table, a special gamma camera is positioned over your abdomen taking pictures of the tracer as it moves through your body. The gamma camera takes pictures continually for about an hour. You'll need to keep still during the HIDA scan. This can become uncomfortable,  but you may find that you can lessen the discomfort by taking deep breaths and thinking about other things. Tell your health care team if you're uncomfortable. The radiologist will watch on a computer the progress of the radioactive tracer through your body. The HIDA scan may be stopped when the radioactive tracer is seen in the gallbladder and enters your small intestine. This typically takes about an hour. In some cases extra imaging will be performed if original images aren't satisfactory, if morphine is given to help visualize the gallbladder or if the medication CCK is given to look at the contraction of the gallbladder. This test typically takes 2 hours to complete. ________________________________________________________________________

## 2014-11-07 ENCOUNTER — Other Ambulatory Visit: Payer: Self-pay

## 2014-11-07 ENCOUNTER — Encounter (HOSPITAL_COMMUNITY): Payer: Self-pay

## 2014-11-07 ENCOUNTER — Ambulatory Visit (HOSPITAL_COMMUNITY)
Admission: RE | Admit: 2014-11-07 | Discharge: 2014-11-07 | Disposition: A | Discharge status: Home / Self Care | Payer: BLUE CROSS/BLUE SHIELD | Source: Ambulatory Visit | Attending: Gastroenterology | Admitting: Gastroenterology

## 2014-11-07 DIAGNOSIS — R112 Nausea with vomiting, unspecified: Secondary | ICD-10-CM | POA: Insufficient documentation

## 2014-11-07 DIAGNOSIS — K828 Other specified diseases of gallbladder: Secondary | ICD-10-CM

## 2014-11-07 DIAGNOSIS — R109 Unspecified abdominal pain: Secondary | ICD-10-CM | POA: Insufficient documentation

## 2014-11-07 MED ORDER — TECHNETIUM TC 99M MEBROFENIN IV KIT
5.0100 | PACK | Freq: Once | INTRAVENOUS | Status: AC | PRN
  Administered 2014-11-07: 5.01 via INTRAVENOUS

## 2014-11-07 MED ORDER — SINCALIDE 5 MCG IJ SOLR
0.0200 ug/kg | Freq: Once | INTRAMUSCULAR | Status: AC
  Administered 2014-11-07: 1.4 ug via INTRAVENOUS

## 2014-11-07 MED ORDER — OMEPRAZOLE 40 MG PO CPDR
40.0000 mg | DELAYED_RELEASE_CAPSULE | Freq: Every day | ORAL | Status: DC
Start: 2014-11-07 — End: 2015-03-04

## 2014-11-10 ENCOUNTER — Other Ambulatory Visit: Payer: Self-pay

## 2014-11-10 DIAGNOSIS — R109 Unspecified abdominal pain: Secondary | ICD-10-CM

## 2014-11-25 ENCOUNTER — Telehealth: Payer: Self-pay | Admitting: Gastroenterology

## 2014-11-25 MED ORDER — SUCRALFATE 1 G PO TABS: 1.0000 g | ORAL_TABLET | Freq: Three times a day (TID) | ORAL | Status: DC

## 2014-11-25 MED ORDER — TRAMADOL HCL 50 MG PO TABS: 50.0000 mg | ORAL_TABLET | Freq: Three times a day (TID) | ORAL | Status: DC

## 2014-11-25 NOTE — Telephone Encounter (Signed)
Pt rx for carafate and tramadol has been sent to the pharmacy and pt has been notified

## 2014-11-25 NOTE — Telephone Encounter (Signed)
Ok  Please refill tramadol  pill, one pill tid PRN, disp 90 pills, 6 refills  Refill carafate 1gm, one dose tid, disp 90, 6 refills.

## 2014-11-27 ENCOUNTER — Telehealth: Payer: Self-pay | Admitting: Gastroenterology

## 2014-11-27 NOTE — Telephone Encounter (Signed)
Which medicine?

## 2014-11-27 NOTE — Telephone Encounter (Signed)
Sorry Tramadol, we gave him a prescription on 11/25/14 and the pharmacy wont fill it until 12/04/14.  I just wanted to make sure you know he is getting it early.

## 2014-11-27 NOTE — Telephone Encounter (Signed)
Pt pharmacy has been notified that the refill has been authorized pt notified

## 2014-11-27 NOTE — Telephone Encounter (Signed)
Dr Christella Hartigan is it ok to approve this?

## 2014-11-27 NOTE — Telephone Encounter (Signed)
That is OK

## 2015-01-20 ENCOUNTER — Telehealth: Payer: Self-pay | Admitting: Gastroenterology

## 2015-01-20 NOTE — Telephone Encounter (Signed)
Pt continues to have abd pain, he is scheduled to see Dr Christella Hartigan 03/04/15 but would like to be seen sooner.  I have added him to 02/05/15 with Shanda Bumps.  He will call if he has any further concerns or problems prior to the appt.

## 2015-02-05 ENCOUNTER — Encounter: Payer: Self-pay | Admitting: Gastroenterology

## 2015-02-05 ENCOUNTER — Ambulatory Visit (INDEPENDENT_AMBULATORY_CARE_PROVIDER_SITE_OTHER): Payer: BLUE CROSS/BLUE SHIELD | Admitting: Gastroenterology

## 2015-02-05 VITALS — BP 122/60 | HR 76 | Ht 72.0 in | Wt 171.1 lb

## 2015-02-05 DIAGNOSIS — R1030 Lower abdominal pain, unspecified: Secondary | ICD-10-CM

## 2015-02-05 DIAGNOSIS — G8929 Other chronic pain: Secondary | ICD-10-CM | POA: Insufficient documentation

## 2015-02-05 DIAGNOSIS — R1031 Right lower quadrant pain: Secondary | ICD-10-CM

## 2015-02-05 DIAGNOSIS — R112 Nausea with vomiting, unspecified: Secondary | ICD-10-CM | POA: Diagnosis not present

## 2015-02-05 DIAGNOSIS — R1032 Left lower quadrant pain: Secondary | ICD-10-CM

## 2015-02-05 MED ORDER — HYOSCYAMINE SULFATE ER 0.375 MG PO TBCR: EXTENDED_RELEASE_TABLET | ORAL | Status: DC

## 2015-02-05 MED ORDER — TRAMADOL HCL 50 MG PO TABS: 50.0000 mg | ORAL_TABLET | Freq: Four times a day (QID) | ORAL | Status: DC

## 2015-02-05 MED ORDER — SUCRALFATE 1 G PO TABS: 1.0000 g | ORAL_TABLET | Freq: Three times a day (TID) | ORAL | Status: DC

## 2015-02-05 NOTE — Progress Notes (Signed)
     02/05/2015 Derrick Roberts [id number] [birth year]  Review of pertinent gastrointestinal problems: 1. abd pain, vomiting, ? enteritis on CT 04/2014 "mild small bowel thickening in LUQ that may reflect enteritis;" labs 06/2014 sed rate normal, CBC, cmet normal, IBD serologies equivocal for IBD, crohn's. EGD 07/2014 mild gastritis, biopsies neg for H. Pylori. colonoscopy 07/2014 normal to the terminal ileum. He was taking 4 aleve daily, 2 ibuprofen daily. ER visit 10/2014 for abd pain: WBC slightly elevated, otherwise amylase, CMET were normal. CT scan 10/2014 limited by metal beam effect, no obvious bowel issues however.  MR Angiogram 10/2014 was normal, no clear cause of his pains.  HIDA scan 10/2014 normal.  WCE 09/2014 normal.   History of Present Illness:   24 year old male presents to our office again today with ongoing complaints of abdominal pain and nausea with vomiting. All of this started back in January and has been ongoing since that time. Has constant lower abdominal pain daily at about 5 out of 10 on the pain scale. Gets episodes about 2 or 3 times a week where pain becomes excruciating causing intractable vomiting. Has to leave work and miss work due to these episodes. Says he breaks out in cold sweat and gets chills and shaking. He has been taking Levsin which helps mildly, Zofran for the nausea, and pain medicines that been prescribed by our office including tramadol and Vicodin. Also takes omeprazole 40 mg daily, ranitidine as needed, and Carafate 3 times daily for reflux type of symptoms.   Current Medications, Allergies, Past Medical History, Past Surgical History, Family History and Social History were reviewed in [facility] electronic medical record.   Physical Exam: BP 122/60 mmHg  Pulse 76  Ht 6' (1.829 m)  Wt 171 lb 2 oz (77.622 kg)  BMI 23.20 kg/m2 General: Well developed white male in no acute distress Head: Normocephalic and atraumatic Eyes:  Sclerae anicteric,  conjunctiva pink  Ears: Normal auditory acuity Lungs: Clear throughout to auscultation Heart: Regular rate and rhythm Abdomen: Soft, non-distended.  Normal bowel sounds.  Lower abdominal TTP without R/R/G. Musculoskeletal: Symmetrical with no gross deformities  Extremities: No edema  Neurological: Alert oriented x 4, grossly non-focal Psychological:  Alert and cooperative. Normal mood and affect  Assessment and Recommendations: *24 year old male with ongoing abdominal pain with episodes of worsening pain associated with nausea and vomiting. He has undergone extensive evaluation from a GI standpoint with no definite source identified. He has been prescribed tramadol and hydrocodone on different occasions for treatment of his abdominal pain from this office. I spoke with Dr. Christella HartiganJacobs who thinks that we do not have anything further to offer him as far as evaluation and thinks that he should seek a second opinion. Patient prefers [facility] as facility for that. Dr. Christella HartiganJacobs has agreed to refill his ultram, which patient is requesting 4 times daily. We'll give them in place of the Vicodin. He is also asking for refills on his Levsin and Carafate, which we will provide at this time.

## 2015-02-05 NOTE — Patient Instructions (Signed)
We sent prescriptions to [facility] [address] 1. Carafate Tablets 2. Hyoscyamine tablets( Levsin)  3. We faxed the Tramadol ( Ultram)  Prescription to [facility] [address]  We are doing a referral to [facility] ( ,[place]). We are sending them your records and they will be calling you with an appointment.

## 2015-02-06 NOTE — Progress Notes (Signed)
I agree with the above note, plan 

## 2015-02-23 ENCOUNTER — Telehealth: Payer: Self-pay | Admitting: Gastroenterology

## 2015-02-23 NOTE — Telephone Encounter (Signed)
Pt states [facility] be [facility] until Feb. Reports he is still having problems with the abdominal pain and wants to know what he should/can do until that appt. Please advise.

## 2015-02-23 NOTE — Telephone Encounter (Signed)
I do not know what is causing his pains, but am happy to refill his pain meds (tramadol , I believe) until then.  If pains become severe he should call here.

## 2015-02-24 ENCOUNTER — Ambulatory Visit (INDEPENDENT_AMBULATORY_CARE_PROVIDER_SITE_OTHER): Payer: BLUE CROSS/BLUE SHIELD | Admitting: Family Medicine

## 2015-02-24 ENCOUNTER — Telehealth: Payer: Self-pay | Admitting: Gastroenterology

## 2015-02-24 ENCOUNTER — Encounter: Payer: Self-pay | Admitting: Family Medicine

## 2015-02-24 VITALS — BP 104/80 | HR 81 | Temp 98.7°F | Ht 72.0 in | Wt 164.8 lb

## 2015-02-24 DIAGNOSIS — R1031 Right lower quadrant pain: Secondary | ICD-10-CM | POA: Diagnosis not present

## 2015-02-24 DIAGNOSIS — F4321 Adjustment disorder with depressed mood: Secondary | ICD-10-CM | POA: Diagnosis not present

## 2015-02-24 DIAGNOSIS — G8929 Other chronic pain: Secondary | ICD-10-CM

## 2015-02-24 DIAGNOSIS — R1032 Left lower quadrant pain: Secondary | ICD-10-CM

## 2015-02-24 DIAGNOSIS — M546 Pain in thoracic spine: Secondary | ICD-10-CM

## 2015-02-24 MED ORDER — HYDROCODONE-ACETAMINOPHEN 10-325 MG PO TABS: 1.0000 | ORAL_TABLET | Freq: Every day | ORAL | Status: DC | PRN

## 2015-02-24 NOTE — Assessment & Plan Note (Signed)
Not discussed in detail with pt and mother today, but pt certainly seems depressed. May be worsening symptoms. Consider re-eval for this by PCP at next OV. Will CC them.

## 2015-02-24 NOTE — Progress Notes (Signed)
Pre visit review using our clinic review tool, if applicable. No additional management support is needed unless otherwise documented below in the visit note. 

## 2015-02-24 NOTE — Assessment & Plan Note (Addendum)
Has had extensive work up. Tried multiple treatments.  Has appt with [facility] specialist in 05/2014.  Having flare of severe pain now.  Unclear cause.. ? IBD although colonoscopy nml.  ? Bacterial overgrowth syndrome..more severe than typical symptoms I have seen before. But he has weight loss, bloating, flatulance and pain. Start probiotic. Consider SIBO breath test at upcoming GI appt. ? Mesenteric ischemia.Fredricka Bonine. Unlikely at his age.  Secondary to back issues? NO very consistent with this but given exetensive back surgery.. May want to consult Dr. Theresia LoFitch his back surgeon.  Given small amount of vicodin for breakthrough pain. Counseled pt on SE and tolerance issues of this medication.  Total visit time 25 minutes, > 50% spent counseling and cordinating patients care.

## 2015-02-24 NOTE — Telephone Encounter (Signed)
He has had several tests (see OV note from 3 weeks ago) without any clear etiology of his pains.  He should go to the ER for evaluation if he is having severe pains now.

## 2015-02-24 NOTE — Progress Notes (Signed)
Subjective:    Patient ID: Derrick Roberts, male    DOB: December 01, 1990, 24 y.o.   MRN: [id number]  HPI  24 year old male pt of Dr. Royden Purl  with history of chronic abdominal pain and N/V x 1 year presents with  abdominal pain and emesis x 3 days.  On zantac, tramadol, zofran, prilosec, levsin and carafate.   Reviewed last OV notes from  GI Dr. Larae Grooms on 02/05/15.. " He has undergone extensive evaluation from a GI standpoint with no definite source identified. He has been prescribed tramadol and hydrocodone on different occasions for treatment of his abdominal pain from this office. I spoke with Dr. Christella Hartigan who thinks that we do not have anything further to offer him as far as evaluation and thinks that he should seek a second opinion. Patient prefers [facility]. Given refill of tramadol in place of vicodin as well as levsin and carafate."  CT 04/2014 "mild small bowel thickening in LUQ that may reflect enteritis;" labs 06/2014 sed rate normal, CBC, cmet normal, IBD serologies equivocal for IBD, crohn's. EGD 07/2014 mild gastritis, biopseis neg for H. Pylori. colonoscopy 07/2014 normal to the terminal ileum.  nml lipase. Neg UA.  Has appt set up in 05/2014 with [facility].  Today he reports attacks  of sweating, sharp stabbing pains in central lower abdomen, radiates to back.  Last several hoursChills. No fever. Crys uncontrollable. When really bad.. emesis and nausea.  eating makes it worse, cannot sleep. No diarrhea, no constipation. No blood in stool.  Losing weight.  Mild for few days then flares strongly.  Zofran helps with nausea some. Tramadol not strong enough for pain.  Mother speaks for him. He had noted no spells for 2 months after antibiotic for infected tooth. She wonders if he has bacterial overgrowth.  Has lost weight from 220 last year.  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 He had smith peterson osteotomies from T8-L1  Had extensive back surgery with Dr. Theresia Lo. Replacement of multiple discs.  05/2013. Findings and impression:   Thoracolumbar fusion hardware from T6-L3 is unchanged in appearance fromprior, with no evidence of failure or complication. Unchanged vertebral body heights and disc spaces. Unchanged alignment. Multilevel endplate  irregularities in the thoracic spine, similar to prior.     Review of Systems  Constitutional: Positive for fatigue. Negative for fever.  HENT: Negative for ear pain.   Eyes: Negative for pain.  Respiratory: Negative for cough, shortness of breath and wheezing.   Cardiovascular: Negative for chest pain, palpitations and leg swelling.  Gastrointestinal: Positive for nausea, vomiting, abdominal pain and abdominal distention. Negative for diarrhea, constipation, blood in stool, anal bleeding and rectal pain.  Genitourinary: Negative for dysuria and penile pain.       Objective:   Physical Exam  Constitutional: Vital signs are normal. He appears well-developed. He appears lethargic. He appears cachectic.  Non-toxic appearance. He has a sickly appearance. He appears ill.  Pt crying with pain  HENT:  Head: Normocephalic.  Right Ear: Hearing normal.  Left Ear: Hearing normal.  Nose: Nose normal.  Mouth/Throat: Oropharynx is clear and moist and mucous membranes are normal.  Neck: Trachea normal. Carotid bruit is not present. No thyroid mass and no thyromegaly present.  Cardiovascular: Normal rate, regular  rhythm and normal pulses.  Exam reveals no gallop, no distant heart sounds and no friction rub.   No murmur heard. No peripheral edema  Pulmonary/Chest: Effort normal and breath sounds normal. No respiratory distress.  Abdominal: There is no hepatosplenomegaly. There is tenderness in the periumbilical area and suprapubic area. There is guarding. There is  no rigidity, no rebound and no CVA tenderness.  Neurological: He appears lethargic.  Skin: Skin is warm, dry and intact. No rash noted.  Psychiatric: His speech is normal. Thought content normal. His affect is blunt. He is slowed and withdrawn. He exhibits a depressed mood.          Assessment & Plan:

## 2015-02-24 NOTE — Telephone Encounter (Signed)
Dr Morey HummingbirdJacobs Roberts has called this morning stating he is having severe pain today and was told to call here.  Do you want him to be seen or go to the ER?

## 2015-02-24 NOTE — Patient Instructions (Addendum)
Start align over the counter for a probiotic.  A diet consisting of high fat, low carbohydrate, and low fiber may reduce symptoms. As lactase deficiency develops in many adult patients with SIBO, lactose-containing foods should be avoided.  Use tramadol  for pain and vicodin only for severe pain.  Keep appt next week with Dr. Larae GroomsJacob's.  If back pain remains a possible issue.. Make follow up appt with Dr. Theresia LoFitch for consideration of connection.

## 2015-02-24 NOTE — Assessment & Plan Note (Signed)
Mild on exam. Abdominal pain connected to thoracic nerve issue?

## 2015-02-24 NOTE — Telephone Encounter (Signed)
See alternate note  

## 2015-02-24 NOTE — Telephone Encounter (Signed)
Pt has been advised to go to the ER, he states he has an appt today with his PCP and will keep that

## 2015-02-24 NOTE — Telephone Encounter (Signed)
Patient called back in stating that he is still having severe abd pains. He states that he is also having nausea. Best # [phone]

## 2015-03-04 ENCOUNTER — Other Ambulatory Visit (INDEPENDENT_AMBULATORY_CARE_PROVIDER_SITE_OTHER): Payer: BLUE CROSS/BLUE SHIELD

## 2015-03-04 ENCOUNTER — Encounter: Payer: Self-pay | Admitting: Gastroenterology

## 2015-03-04 ENCOUNTER — Ambulatory Visit (INDEPENDENT_AMBULATORY_CARE_PROVIDER_SITE_OTHER): Payer: BLUE CROSS/BLUE SHIELD | Admitting: Gastroenterology

## 2015-03-04 VITALS — BP 106/60 | HR 76 | Ht 70.25 in | Wt 177.2 lb

## 2015-03-04 DIAGNOSIS — R1030 Lower abdominal pain, unspecified: Secondary | ICD-10-CM

## 2015-03-04 LAB — CREATININE, SERUM: CREATININE: 0.78 mg/dL (ref 0.40–1.50)

## 2015-03-04 MED ORDER — HYOSCYAMINE SULFATE ER 0.375 MG PO TBCR: EXTENDED_RELEASE_TABLET | ORAL | Status: DC

## 2015-03-04 MED ORDER — OMEPRAZOLE 40 MG PO CPDR: 40.0000 mg | DELAYED_RELEASE_CAPSULE | Freq: Every day | ORAL | Status: DC

## 2015-03-04 MED ORDER — TRAMADOL HCL 50 MG PO TABS: 50.0000 mg | ORAL_TABLET | Freq: Four times a day (QID) | ORAL | Status: DC | PRN

## 2015-03-04 MED ORDER — HYDROCODONE-ACETAMINOPHEN 10-325 MG PO TABS: 1.0000 | ORAL_TABLET | Freq: Every day | ORAL | Status: DC | PRN

## 2015-03-04 NOTE — Progress Notes (Signed)
Review of pertinent gastrointestinal problems: 1. abd pain, vomiting, ? enteritis on CT 04/2014 "mild small bowel thickening in LUQ that may reflect enteritis;" labs 06/2014 sed rate normal, CBC, cmet normal, IBD serologies equivocal for IBD, crohn's. EGD 07/2014 mild gastritis, biopseis neg for H. Pylori. colonoscopy 07/2014 normal to the terminal ileum. He was taking 4 alleve daily, 2 ibuprofen daily. ER visit 10/2014 for abd pain: WBC slightly elevated, otherwise amylase, CMET were normal. CT scan 10/2014 limited by metal beam effect, no obvious bowel issues however. Capsule endoscopy 09/2014 was normal. MR Angiogram 10/2014 was normal, no clear cause of his pains. HIDA scan 10/2014 showed normal GB EF.  HPI: This is a pleasant 24 year old man whom I last saw 2 or 3 months ago. He has had extensive workup for lower abdominal pains without any clear etiology. See the testing summarized above.  Chief complaint is persistent continued intermittent abdominal pains  Lower abdominal pains, lower back pains.  Discrete attacks. The pain is unbearable during for 4-6 hours.  During this time it burns and hurts to urinate.  Feels warm but no objective fevers.   Past Medical History  Diagnosis Date  . History of allergic rhinitis   . History of asthma     mild  . History of kyphosis     Shueremann's Kyphosis    Past Surgical History  Procedure Laterality Date  . Elbow fracture surgery  05/1998  . Spinal fusion  2009    sx for shueremann's kyphosis    Current Outpatient Prescriptions  Medication Sig Dispense Refill  . acetaminophen (TYLENOL) 500 MG tablet Take 1,000 mg by mouth every 6 (six) hours as needed for pain.     Marland Kitchen. albuterol (PROVENTIL HFA;VENTOLIN HFA) 108 (90 BASE) MCG/ACT inhaler Inhale 2 puffs into the lungs every 4 (four) hours as needed for wheezing. 1 Inhaler 0  . HYDROcodone-acetaminophen (NORCO) 10-325 MG tablet Take 1 tablet by mouth daily as needed for severe pain. 15 tablet 0  .  Hyoscyamine Sulfate 0.375 MG TBCR Take 1 tab twice daily only. 60 tablet 1  . Nutritional Supplements (NUTRITIONAL SHAKE PLUS PROTEIN PO) Take 1 Can by mouth daily.    Marland Kitchen. omeprazole (PRILOSEC) 40 MG capsule Take 1 capsule (40 mg total) by mouth daily. 90 capsule 3  . ondansetron (ZOFRAN) 4 MG tablet Take 1 tablet (4 mg total) by mouth every 6 (six) hours. (Patient taking differently: Take 4 mg by mouth every 6 (six) hours as needed for nausea or vomiting. ) 12 tablet 0  . ranitidine (ZANTAC) 150 MG capsule Take 150 mg by mouth daily as needed for heartburn.    . sucralfate (CARAFATE) 1 G tablet Take 1 tablet (1 g total) by mouth 4 (four) times daily -  with meals and at bedtime. 120 tablet 3  . traMADol (ULTRAM) 50 MG tablet Take 1 tablet (50 mg total) by mouth 4 (four) times daily. 120 tablet 0   No current facility-administered medications for this visit.    Allergies as of 03/04/2015 - Review Complete 03/04/2015  Allergen Reaction Noted  . Aspirin Nausea Only and Other (See Comments)   . Methylphenidate hcl  07/11/2007    Family History  Problem Relation Age of Onset  . Ulcers Maternal Grandmother   . Irritable bowel syndrome Mother   . Lung cancer Maternal Grandmother     Social History   Social History  . Marital Status: Single    Spouse Name: N/A  . Number of  Children: N/A  . Years of Education: N/A   Occupational History  . [employer]    Social History Main Topics  . Smoking status: Former Smoker -- 0.50 packs/day    Types: Cigarettes    Quit date: 01/12/2015  . Smokeless tobacco: Former Neurosurgeon    Quit date: 08/26/2008  . Alcohol Use: No  . Drug Use: No  . Sexual Activity: No   Other Topics Concern  . Not on file   Social History Narrative     Physical Exam: BP 106/60 mmHg  Pulse 76  Ht 5' 10.25" (1.784 m)  Wt 177 lb 4 oz (80.4 kg)  BMI 25.26 kg/m2 Constitutional: generally well-appearing Psychiatric: alert and oriented x3 Abdomen: soft, nontender,  nondistended, no obvious ascites, no peritoneal signs, normal bowel sounds   Assessment and plan: 24 y.o. male with persistent severe lower abdominal pains intermittent  Extensive GI workup that has been essentially unrevealing. Perhaps he has porphyria attacks. I will get urine testing for this. I'm happy to refill his antispasmodic his proton pump inhibitor history him at all and I will give him 25 narcotic pain pills. We have artery arrange for evaluation at a tertiary center for this lower abdominal pain. I don't think he is drug-seeking but that is always hard to be certain of. If the urine porphyria testing is all negative then I will likely set him up with a urologist since he does seem to have some urinary symptoms during his acute pains.   Rob Bunting, MD  Gastroenterology 03/04/2015, 9:02 AM

## 2015-03-04 NOTE — Patient Instructions (Addendum)
Urine for total PBG, porphyrins, creatinine. Will refill you hyosciamine, prilosec, tramadol (50 mg pills, 4 daily, 1 month, 3 refills).  Norco refill, 25 pills.

## 2015-03-06 LAB — PORPHOBILINOGEN, RANDOM URINE

## 2015-03-10 ENCOUNTER — Other Ambulatory Visit: Payer: Self-pay

## 2015-03-10 ENCOUNTER — Telehealth: Payer: Self-pay | Admitting: Gastroenterology

## 2015-03-10 DIAGNOSIS — R1084 Generalized abdominal pain: Secondary | ICD-10-CM

## 2015-03-10 NOTE — Telephone Encounter (Signed)
Pt aware of creatinine results. Other specimen was cancelled due to urine specimen not being frozen. Pt to come and give another specimen. Pt aware.

## 2015-03-18 ENCOUNTER — Telehealth: Payer: Self-pay | Admitting: Gastroenterology

## 2015-03-18 ENCOUNTER — Other Ambulatory Visit: Payer: BLUE CROSS/BLUE SHIELD

## 2015-03-18 DIAGNOSIS — R1084 Generalized abdominal pain: Secondary | ICD-10-CM

## 2015-03-18 NOTE — Telephone Encounter (Signed)
Ok to write letter

## 2015-03-18 NOTE — Telephone Encounter (Signed)
Pt is requesting a note to return back to work tomorrow on [employer] duty. Dr. Christella HartiganJacobs has already left for the day. Pt has seen Doug SouJessica Zehr PA also. Is it ok to write letter for pt to return to work on light duty? Please advise.

## 2015-03-19 NOTE — Telephone Encounter (Signed)
Patient is requesting to pickup letter when this is ready.

## 2015-03-19 NOTE — Telephone Encounter (Signed)
Pt aware that he can have the higher dosage as needed.  He will contact us as needed for refills.

## 2015-03-19 NOTE — Telephone Encounter (Signed)
Asking for the note to state " No Lifting over 50pounds. No extensive or sudden movements please."

## 2015-03-19 NOTE — Telephone Encounter (Signed)
Letter written and given to pt as per Doug SouJessica Zehr PA also has been faxed to [place] at [phone] per pt.  Pt also sch to see Dr Christella HartiganJacobs for follow up 06/02/15.  Dr Christella HartiganJacobs the pt would like to be seen sooner do you want to double book or leave as is?

## 2015-03-19 NOTE — Telephone Encounter (Signed)
[facility] but wanted to see you prior to that appt.  I will call him and advise him to keep the [facility] appt as scheduled.  Pt states he is taking 5-6 tramadol daily and is running out of medication 10 days early every month.  Please advise.

## 2015-03-19 NOTE — Telephone Encounter (Signed)
[facility] is [facility] for [facility] prior to then.  I think he already had appt set. Can you check on that.  Please do not double book for me.  He's had extensive testing, essentially all the tests we can offer.

## 2015-03-19 NOTE — Telephone Encounter (Signed)
I'm happy to refill at higher dose when needed

## 2015-03-21 ENCOUNTER — Telehealth: Payer: Self-pay | Admitting: Gastroenterology

## 2015-03-21 NOTE — Telephone Encounter (Signed)
Pt calling because an increase in Tramadol dose was not sent to his pharmacy. He has enough to last until Tuesday. He is advised to call the office during regular office hours on Monday to address this.

## 2015-03-23 ENCOUNTER — Telehealth: Payer: Self-pay | Admitting: Gastroenterology

## 2015-03-23 MED ORDER — TRAMADOL HCL 50 MG PO TABS: 100.0000 mg | ORAL_TABLET | Freq: Four times a day (QID) | ORAL | Status: DC | PRN

## 2015-03-23 NOTE — Telephone Encounter (Signed)
Ok tramadol 100mg  dose, one pill every 6 hours as needed.  Disp 120 pills, 3 refills.  thanks

## 2015-03-23 NOTE — Telephone Encounter (Signed)
Pt aware that the pharmacy has been notified and prescription was authorized for pick up today

## 2015-03-23 NOTE — Telephone Encounter (Signed)
Dr Christella HartiganJacobs please advise, pt asking for increase in Tramadol dose.

## 2015-03-23 NOTE — Telephone Encounter (Signed)
Prescription has been sent to the pharmacy pt has been notified

## 2015-04-01 LAB — PORPHOBILINOGEN, RANDOM URINE: PORPHOBILINOGENR UR: 1.2 mg/L (ref 0.0–2.0)

## 2015-04-07 ENCOUNTER — Telehealth: Payer: Self-pay | Admitting: Gastroenterology

## 2015-04-07 NOTE — Telephone Encounter (Signed)
Dr Christella HartiganJacobs please respond the pt is requesting pain meds.

## 2015-04-08 NOTE — Telephone Encounter (Signed)
Patient calling back regarding this.  °

## 2015-04-08 NOTE — Telephone Encounter (Signed)
Pt notified that the message was sent to Dr Christella HartiganJacobs and as soon as a response is received I will give him a call.

## 2015-04-09 MED ORDER — TRAMADOL HCL 50 MG PO TABS: 100.0000 mg | ORAL_TABLET | Freq: Four times a day (QID) | ORAL | Status: DC | PRN

## 2015-04-09 NOTE — Telephone Encounter (Signed)
Tramadol prescription has been sent to the pharmacy, pt has been notified.

## 2015-04-09 NOTE — Telephone Encounter (Signed)
I'm happy to refill his tramadol at most recent dosing.

## 2015-04-20 ENCOUNTER — Telehealth: Payer: Self-pay | Admitting: Gastroenterology

## 2015-04-21 NOTE — Telephone Encounter (Signed)
Dr Christella HartiganJacobs the pt is requesting Norco 10/325 last filled 03/04/15 #25, do you want want to refill.  He says he is having severe abd pain not relieved by tramadol.

## 2015-04-21 NOTE — Telephone Encounter (Signed)
Patient calling in regarding this.  °

## 2015-04-22 NOTE — Telephone Encounter (Signed)
No,  I have done extensive testing and have no clear cause for his pains.  Increasingly concerned about drug seeking.

## 2015-04-22 NOTE — Telephone Encounter (Signed)
Left message on machine to call back  

## 2015-04-22 NOTE — Telephone Encounter (Signed)
Pt has been notified of Dr Christella HartiganJacobs recommendations

## 2015-04-28 ENCOUNTER — Telehealth: Payer: Self-pay | Admitting: Gastroenterology

## 2015-04-28 NOTE — Telephone Encounter (Signed)
Dr Christella Hartigan do you want to increase the strength of the tramadol for the pt?

## 2015-04-29 MED ORDER — TRAMADOL HCL 50 MG PO TABS: 100.0000 mg | ORAL_TABLET | Freq: Four times a day (QID) | ORAL | Status: DC | PRN

## 2015-04-29 NOTE — Telephone Encounter (Signed)
Pt aware he is at the max dose.  I have faxed a new script for #240 tabs 1 every 6 hours PRN with 3 refills.

## 2015-04-29 NOTE — Telephone Encounter (Signed)
Maximum dose is  every 6 hours (max total per day is  per day).  I think he is already at that max dose.  Can you make sure he is getting that dose  every 6 hours PRN, prescribe 1 month with 11 refills.

## 2015-05-22 ENCOUNTER — Ambulatory Visit (INDEPENDENT_AMBULATORY_CARE_PROVIDER_SITE_OTHER): Payer: BLUE CROSS/BLUE SHIELD | Admitting: Family Medicine

## 2015-05-22 ENCOUNTER — Encounter: Payer: Self-pay | Admitting: Family Medicine

## 2015-05-22 VITALS — BP 122/86 | HR 80 | Temp 98.3°F | Ht 70.25 in | Wt 158.0 lb

## 2015-05-22 DIAGNOSIS — F4321 Adjustment disorder with depressed mood: Secondary | ICD-10-CM

## 2015-05-22 DIAGNOSIS — Z23 Encounter for immunization: Secondary | ICD-10-CM

## 2015-05-22 DIAGNOSIS — G8929 Other chronic pain: Secondary | ICD-10-CM

## 2015-05-22 DIAGNOSIS — R1084 Generalized abdominal pain: Secondary | ICD-10-CM

## 2015-05-22 DIAGNOSIS — G43D0 Abdominal migraine, not intractable: Secondary | ICD-10-CM

## 2015-05-22 MED ORDER — AMITRIPTYLINE HCL 10 MG PO TABS: 30.0000 mg | ORAL_TABLET | Freq: Every day | ORAL | Status: DC

## 2015-05-22 MED ORDER — SUCRALFATE 1 G PO TABS: 1.0000 g | ORAL_TABLET | Freq: Three times a day (TID) | ORAL | Status: DC

## 2015-05-22 MED ORDER — OMEPRAZOLE 40 MG PO CPDR: 40.0000 mg | DELAYED_RELEASE_CAPSULE | Freq: Every day | ORAL | Status: AC

## 2015-05-22 NOTE — Progress Notes (Signed)
Pre visit review using our clinic review tool, if applicable. No additional management support is needed unless otherwise documented below in the visit note. 

## 2015-05-22 NOTE — Patient Instructions (Addendum)
EKG now and if that is normal I will send amitriptyline to your pharmacy  Start with amitriptyline 10 mg one pill in the evening before bed for 1-2 weeks If tolerating that ok increase to 20 mg each evening for 1-2 week  If tolerating that ok increase to 30 mg each evening   Alert me if side effects If you feel more depressed or suicidal  stop the medicine and let me know   Continue other medicines as needed   If you change your mind about counseling - call and let me know   Follow up in about 4-6 weeks with me

## 2015-05-22 NOTE — Progress Notes (Signed)
Subjective:    Patient ID: Derrick Roberts, male    DOB: 25-Jul-1990, 25 y.o.   MRN: [id number]  HPI  Here with ongoing abdominal pain and n/v   A lot of stress Prior back problems and back surgery and chronic pain  His current job is very stressful also   ? Concern about depression issues  He does admit to feeling depressed  Hopeless feeling No SI at all  Is slowed down/ tired/not sleeping well and no appetite Disc coping skills  Has impacted every day life much  Takes carafate- it helps a little    Last summer - he was on a strong antibiotic for dental problems - (2 weeks) and he was symptom free for 2 weeks -then symptoms gradually came back  Of note has had neg bx for H pylori  Takes ompeprazole daily  occ zofran   Rev notes/records from both [facility] and [facility] in detail today with pt and his mother ? If this may be abd migraine -they are interested in what this is and how it is treated    Patient Active Problem List   Diagnosis Date Noted  . Chronic generalized abdominal pain 05/22/2015  . Abdominal migraine 05/22/2015  . 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 Situational depression 02/24/2015  . Chronic bilateral lower abdominal pain 02/05/2015  . Nausea with vomiting 02/05/2015  . Enteritis 05/09/2014  . Controlled substance agreement signed 05/05/2013  . Back pain 07/02/2012  . PARESTHESIA 06/15/2010  . ADD 10/20/2006  . SCOLIOSIS NEC 10/20/2006  . ASTHMA 10/19/2006  . ECZEMA 10/19/2006   Past Medical History  Diagnosis Date  . History of allergic rhinitis   . History of asthma     mild  . History of kyphosis     Shueremann's Kyphosis   Past Surgical History  Procedure Laterality Date  . Elbow fracture surgery  05/1998  . Spinal fusion  2009    sx for shueremann's kyphosis   Social History  Substance Use Topics  . Smoking status: Current Every Day Smoker    Types: Cigarettes    Last Attempt to Quit: 01/12/2015   . Smokeless tobacco: Former Neurosurgeon    Quit date: 08/26/2008     Comment: 0.25-0.5 packs per day  . Alcohol Use: No   Family History  Problem Relation Age of Onset  . Ulcers Maternal Grandmother   . Irritable bowel syndrome Mother   . Lung cancer Maternal Grandmother    Allergies  Allergen Reactions  . Aspirin Nausea Only and Other (See Comments)    Stomach problems  . Methylphenidate Hcl     REACTION: diarrhea   Current Outpatient Prescriptions on File Prior to Visit  Medication Sig Dispense Refill  . acetaminophen (TYLENOL) 500 MG tablet Take 1,000 mg by mouth every 6 (six) hours as needed for pain.     Marland Kitchen albuterol (PROVENTIL HFA;VENTOLIN HFA) 108 (90 BASE) MCG/ACT inhaler Inhale 2 puffs into the lungs every 4 (four) hours as needed for wheezing. 1 Inhaler 0  . Hyoscyamine Sulfate 0.375 MG TBCR Take 1 tab twice daily only. 60 tablet 11  . Nutritional Supplements (NUTRITIONAL SHAKE PLUS PROTEIN PO) Take 1 Can by mouth daily.    . ondansetron (ZOFRAN) 4 MG tablet Take 1 tablet (4 mg total) by mouth every 6 (six) hours. (Patient taking differently: Take 4 mg by mouth every 6 (six) hours as needed for nausea or vomiting. ) 12 tablet 0  .  ranitidine (ZANTAC) 150 MG capsule Take 150 mg by mouth daily as needed for heartburn.     No current facility-administered medications on file prior to visit.    Review of Systems    Review of Systems  Constitutional: Negative for fever, appetite change, and unexpected weight change. pos for decreased quality of life  Eyes: Negative for pain and visual disturbance.  Respiratory: Negative for cough and shortness of breath.   Cardiovascular: Negative for cp or palpitations    Gastrointestinal: Negative for , diarrhea and constipation. neg for blood in stool or dark stool Genitourinary: Negative for urgency and frequency.  Skin: Negative for pallor or rash   Neurological: Negative for weakness, light-headedness, numbness and headaches.    Hematological: Negative for adenopathy. Does not bruise/bleed easily.  Psychiatric/Behavioral: pos for dysphoric mood/ poor sleep and fatigue     Objective:   Physical Exam  Constitutional: He appears well-developed and well-nourished. No distress.  Depressed appearing Wt loss noted   HENT:  Head: Normocephalic and atraumatic.  Mouth/Throat: Oropharynx is clear and moist.  Eyes: Conjunctivae and EOM are normal. Pupils are equal, round, and reactive to light. No scleral icterus.  Neck: Normal range of motion. Neck supple.  Cardiovascular: Normal rate, regular rhythm and normal heart sounds.   Pulmonary/Chest: Effort normal and breath sounds normal. No respiratory distress. He has no wheezes. He has no rales.  Abdominal: Soft. Bowel sounds are normal. He exhibits no distension and no mass. There is no tenderness. There is no rebound and no guarding.  Lymphadenopathy:    He has no cervical adenopathy.  Neurological: He is alert.  Skin: Skin is warm and dry. No erythema. No pallor.  Psychiatric: His mood appears not anxious. His affect is blunt. His affect is not labile and not inappropriate. His speech is delayed. He is slowed. He is not agitated and not aggressive. Thought content is not paranoid. He exhibits a depressed mood. He expresses no homicidal and no suicidal ideation.  Not tearful Slowed motor fxn but not cognition           Assessment & Plan:   Problem List Items Addressed This Visit      Cardiovascular and Mediastinum   Abdominal migraine    Reviewed large GI w/u for abd pain from [facility] and [facility] as well as history and wt loss  This has caused depressive symptoms  Suspect some component of functional abdominal pain - disc this in detail  Counseling offered-he may benefit  Continues to watch for foods that worsen symptoms  Given amitriptyline for this - start with 10 mg and titrate up to 30 as tol Discussed expectations of this medication including time to  effectiveness and mechanism of action, also poss of side effects (early and late)- including mental fuzziness, weight or appetite change, nausea and poss of worse dep or anxiety (even suicidal thoughts)  Pt voiced understanding and will stop med and update if this occurs   He voiced understanding Hope this will improve mood and appetite and abd symptoms Close f/u planned  >25 minutes spent in face to face time with patient, >50% spent in counselling or coordination of care         Other   Adjustment reaction with prolonged depressive reaction    From/with abdominal pain symptoms  Trial of amitriptyline- titrate up as tol with close f/u Discussed expectations of this  medication including time to effectiveness and mechanism of action, also poss of side effects (early and  late)- including mental fuzziness, weight or appetite change, nausea and poss of worse dep or anxiety (even suicidal thoughts)  Pt voiced understanding and will stop med and update if this occurs  Offered counseling Close f/u  Hope this would also help sleep and appetite >25 minutes spent in face to face time with patient, >50% spent in counselling or coordination of care        Chronic generalized abdominal pain    Reviewed large GI w/u for abd pain from [place] and [facility] as well as history and wt loss  Disc poss of abdominal migraine  This has caused depressive symptoms  Suspect some component of functional abdominal pain - disc this in detail  Counseling offered-he may benefit  Continues to watch for foods that worsen symptoms  Given amitriptyline for this - start with 10 mg and titrate up to 30 as tol Discussed expectations of this medication including time to effectiveness and mechanism of action, also poss of side effects (early and late)- including mental fuzziness, weight or appetite change, nausea and poss of worse dep or anxiety (even suicidal thoughts)  Pt voiced understanding and will stop med and update if  this occurs   He voiced understanding Hope this will improve mood and appetite and abd symptoms Close f/u planned  >25 minutes spent in face to face time with patient, >50% spent in counselling or coordination of care      Relevant Orders   EKG 12-Lead (Completed)    Other Visit Diagnoses    Need for influenza vaccination    -  Primary    Relevant Orders    Flu Vaccine QUAD 36+ mos PF IM (Fluarix & Fluzone Quad PF) (Completed)

## 2015-05-24 NOTE — Assessment & Plan Note (Signed)
From/with abdominal pain symptoms  Trial of amitriptyline- titrate up as tol with close f/u Discussed expectations of this  medication including time to effectiveness and mechanism of action, also poss of side effects (early and late)- including mental fuzziness, weight or appetite change, nausea and poss of worse dep or anxiety (even suicidal thoughts)  Pt voiced understanding and will stop med and update if this occurs  Offered counseling Close f/u  Hope this would also help sleep and appetite >25 minutes spent in face to face time with patient, >50% spent in counselling or coordination of care

## 2015-05-24 NOTE — Assessment & Plan Note (Signed)
Reviewed large GI w/u for abd pain from  and [facility] as well as history and wt loss  Disc poss of abdominal migraine  This has caused depressive symptoms  Suspect some component of functional abdominal pain - disc this in detail  Counseling offered-he may benefit  Continues to watch for foods that worsen symptoms  Given amitriptyline for this - start with 10 mg and titrate up to 30 as tol Discussed expectations of this medication including time to effectiveness and mechanism of action, also poss of side effects (early and late)- including mental fuzziness, weight or appetite change, nausea and poss of worse dep or anxiety (even suicidal thoughts)  Pt voiced understanding and will stop med and update if this occurs   He voiced understanding Hope this will improve mood and appetite and abd symptoms Close f/u planned  >25 minutes spent in face to face time with patient, >50% spent in counselling or coordination of care

## 2015-05-24 NOTE — Assessment & Plan Note (Signed)
Reviewed large GI w/u for abd pain from [facility] and [facility] as well as history and wt loss  This has caused depressive symptoms  Suspect some component of functional abdominal pain - disc this in detail  Counseling offered-he may benefit  Continues to watch for foods that worsen symptoms  Given amitriptyline for this - start with 10 mg and titrate up to 30 as tol Discussed expectations of this medication including time to effectiveness and mechanism of action, also poss of side effects (early and late)- including mental fuzziness, weight or appetite change, nausea and poss of worse dep or anxiety (even suicidal thoughts)  Pt voiced understanding and will stop med and update if this occurs   He voiced understanding Hope this will improve mood and appetite and abd symptoms Close f/u planned  >25 minutes spent in face to face time with patient, >50% spent in counselling or coordination of care

## 2015-06-02 ENCOUNTER — Ambulatory Visit: Payer: BLUE CROSS/BLUE SHIELD | Admitting: Gastroenterology

## 2015-06-02 ENCOUNTER — Telehealth: Payer: Self-pay | Admitting: Gastroenterology

## 2015-06-02 NOTE — Telephone Encounter (Signed)
Pt aware no appts available today, he would like to call back to set up follow up appt

## 2015-06-05 ENCOUNTER — Ambulatory Visit (INDEPENDENT_AMBULATORY_CARE_PROVIDER_SITE_OTHER): Payer: BLUE CROSS/BLUE SHIELD | Admitting: Family Medicine

## 2015-06-05 ENCOUNTER — Encounter: Payer: Self-pay | Admitting: Family Medicine

## 2015-06-05 VITALS — BP 122/70 | HR 75 | Temp 98.5°F | Ht 70.25 in | Wt 151.8 lb

## 2015-06-05 DIAGNOSIS — F411 Generalized anxiety disorder: Secondary | ICD-10-CM | POA: Insufficient documentation

## 2015-06-05 DIAGNOSIS — F4321 Adjustment disorder with depressed mood: Secondary | ICD-10-CM | POA: Diagnosis not present

## 2015-06-05 DIAGNOSIS — F419 Anxiety disorder, unspecified: Secondary | ICD-10-CM | POA: Diagnosis not present

## 2015-06-05 DIAGNOSIS — F430 Acute stress reaction: Principal | ICD-10-CM

## 2015-06-05 DIAGNOSIS — G43D0 Abdominal migraine, not intractable: Secondary | ICD-10-CM | POA: Diagnosis not present

## 2015-06-05 MED ORDER — BUSPIRONE HCL 15 MG PO TABS: 7.5000 mg | ORAL_TABLET | Freq: Two times a day (BID) | ORAL | Status: AC

## 2015-06-05 NOTE — Progress Notes (Signed)
Subjective:    Patient ID: Derrick Roberts, male    DOB: [birth year], 25 y.o.   MRN: [id number]  HPI Here for f/u of his dep rxn and also abd pain   Started with the amitriptyline  10 mg made him feel panicky   And groggy in am - but did not sleep well on it   He took 3 days in a row - not for him   Gi symptoms - are basically the same Eating fish and chicken and vegetables Wt is down more - 7 lb   Stressors  Being at home Being in dept  Cannot climb out of his "hole" At [employer] - for 5 years  Father is his boss - that is difficult  Hard for him to take on a leader position -with that situation  Very stressful/a lot of responsibility  Dad is not really aware  Mother wants him to leave [employer]   He took a week off from work  Has felt sluggish and depressed   Wants to get back to welding   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 Anxiety in acute stress reaction 06/05/2015  . Chronic generalized abdominal pain 05/22/2015  . Abdominal migraine 05/22/2015  . 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 Chronic bilateral lower abdominal pain 02/05/2015  . Nausea with vomiting 02/05/2015  . Enteritis 05/09/2014  . Controlled substance agreement signed 05/05/2013  . Back pain 07/02/2012  . PARESTHESIA 06/15/2010  . ADD 10/20/2006  . SCOLIOSIS NEC 10/20/2006  . ASTHMA 10/19/2006  . ECZEMA 10/19/2006   Past Medical History  Diagnosis Date  . History of allergic rhinitis   . History of asthma     mild  . History of kyphosis     Shueremann's Kyphosis   Past Surgical  History  Procedure Laterality Date  . Elbow fracture surgery  05/1998  . Spinal fusion  2009    sx for shueremann's kyphosis   Social History  Substance Use Topics  . Smoking status: Current Every Day Smoker -- 1.50 packs/day    Types: Cigarettes  . Smokeless tobacco: Former Neurosurgeon    Quit date: 08/26/2008  . Alcohol Use: No   Family History  Problem Relation Age of Onset  . Ulcers Maternal Grandmother   . Irritable bowel syndrome Mother   . Lung cancer Maternal Grandmother    Allergies  Allergen Reactions  . Aspirin Nausea Only and Other (See Comments)    Stomach problems  . Methylphenidate Hcl     REACTION: diarrhea   Current Outpatient Prescriptions on File Prior to Visit  Medication Sig Dispense Refill  . acetaminophen (TYLENOL) 500 MG tablet Take 1,000 mg by mouth every 6 (six) hours as needed for pain.     Marland Kitchen albuterol (PROVENTIL HFA;VENTOLIN HFA) 108 (90 BASE) MCG/ACT inhaler Inhale 2 puffs into the lungs every 4 (four) hours as needed for wheezing. 1 Inhaler 0  . Nutritional Supplements (NUTRITIONAL SHAKE PLUS PROTEIN PO) Take 1 Can by mouth daily.    Marland Kitchen  omeprazole (PRILOSEC) 40 MG capsule Take 1 capsule (40 mg total) by mouth daily. 30 capsule 11  . ondansetron (ZOFRAN) 4 MG tablet Take 1 tablet (4 mg total) by mouth every 6 (six) hours. (Patient taking differently: Take 4 mg by mouth every 6 (six) hours as needed for nausea or vomiting. ) 12 tablet 0  . ranitidine (ZANTAC) 150 MG capsule Take 150 mg by mouth daily as needed for heartburn.    . sucralfate (CARAFATE) 1 g tablet Take 1 tablet (1 g total) by mouth 4 (four) times daily -  with meals and at bedtime. 120 tablet 5   No current facility-administered medications on file prior to visit.    Review of Systems Review of Systems  Constitutional: Negative for fever, appetite change, fatigue and unexpected weight change.  Eyes: Negative for pain and visual disturbance.  Respiratory: Negative for cough and shortness of  breath.   Cardiovascular: Negative for cp or palpitations    Gastrointestinal: Negative for , diarrhea and constipation. pos for chronic abd pain / nausea and food intolerance  Genitourinary: Negative for urgency and frequency.  Skin: Negative for pallor or rash   Neurological: Negative for weakness, light-headedness, numbness and headaches.  Hematological: Negative for adenopathy. Does not bruise/bleed easily.  Psychiatric/Behavioral: pos for symptoms of anx and depression, also dependence issues with narcotics, neg for SI       Objective:   Physical Exam  Constitutional: He appears well-developed and well-nourished. No distress.  Pt appears fatigued and chronically ill   HENT:  Head: Normocephalic and atraumatic.  Mouth/Throat: Oropharynx is clear and moist.  Eyes: Conjunctivae and EOM are normal. Pupils are equal, round, and reactive to light.  Neck: Normal range of motion. Neck supple.  Pulmonary/Chest: Effort normal.  Abdominal: Soft. Bowel sounds are normal. There is tenderness.  Lymphadenopathy:    He has no cervical adenopathy.  Neurological: He is alert. He displays no tremor.  Skin: Skin is warm and dry. No rash noted. No erythema. No pallor.  Psychiatric: His mood appears anxious. His affect is blunt. His affect is not labile and not inappropriate. His speech is delayed. He is slowed. Thought content is not paranoid and not delusional. Cognition and memory are normal. He exhibits a depressed mood. He expresses no homicidal and no suicidal ideation. He expresses no suicidal plans and no homicidal plans.  Tearful at times Open to discussing stressors openly today  He is attentive.          Assessment & Plan:   Problem List Items Addressed This Visit      Cardiovascular and Mediastinum   Abdominal migraine    Amitriptyline did not help- caused side eff and made mood worse Reviewed stressors/ coping techniques/symptoms/ support sources/ tx options and side effects in  detail today  He admits to being more anxious lately  Will try buspar 7.5 bid  (Discussed expectations of SSRI medication including time to effectiveness and mechanism of action, also poss of side effects (early and late)- including mental fuzziness, weight or appetite change, nausea and poss of worse dep or anxiety (even suicidal thoughts)  Pt voiced understanding and will stop med and update if this occurs ) Also refer to counseling         Other   Adjustment reaction with prolonged depressive reaction    Reviewed stressors/ coping techniques/symptoms/ support sources/ tx options and side effects in detail today No imp with amitriptyline -intolerant  Trial of buspar  Disc issues with  narcotic use as well  No SI Ref to counseling - will go if he has some ins coverage  Also disc stressors- 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       Anxiety in acute stress reaction - Primary    Worsening his GI/ functional abd pain/ abd migraine  Reviewed stressors/ coping techniques/symptoms/ support sources/ tx options and side effects in detail today  Also disc narcotic use isues Ref to counseling Trial of buspar since amitriptyline was not tolerated Discussed expectations of SSRI medication including time to effectiveness and mechanism of action, also poss of side effects (early and late)- including mental fuzziness, weight or appetite change, nausea and poss of worse dep or anxiety (even suicidal thoughts)  Pt voiced understanding and will stop med and update if this occurs    >25 minutes spent in face to face time with patient, >50% spent in counselling or coordination of care        Relevant Medications   busPIRone (BUSPAR) 15 MG tablet   Other Relevant Orders   Ambulatory referral to Psychology

## 2015-06-05 NOTE — Patient Instructions (Signed)
Try buspar 1/2 pill twice daily  Stop at check out regarding your referral to counseling - review mood issue/stress issues/ drug issues  I do agree with your plan for career change   Follow up with me in about 2 months    Please seek care at [facility] if symptoms worsen or you feel suicidal    Keep me posted

## 2015-06-05 NOTE — Progress Notes (Signed)
Pre visit review using our clinic review tool, if applicable. No additional management support is needed unless otherwise documented below in the visit note. 

## 2015-06-07 NOTE — Assessment & Plan Note (Signed)
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

## 2015-06-07 NOTE — Assessment & Plan Note (Signed)
Worsening his GI/ functional abd pain/ abd migraine  Reviewed stressors/ coping techniques/symptoms/ support sources/ tx options and side effects in detail today  Also disc narcotic use isues Ref to counseling Trial of buspar since amitriptyline was not tolerated Discussed expectations of SSRI medication including time to effectiveness and mechanism of action, also poss of side effects (early and late)- including mental fuzziness, weight or appetite change, nausea and poss of worse dep or anxiety (even suicidal thoughts)  Pt voiced understanding and will stop med and update if this occurs    >25 minutes spent in face to face time with patient, >50% spent in counselling or coordination of care

## 2015-06-07 NOTE — Assessment & Plan Note (Signed)
Amitriptyline did not help- caused side eff and made mood worse Reviewed stressors/ coping techniques/symptoms/ support sources/ tx options and side effects in detail today  He admits to being more anxious lately  Will try buspar 7.5 bid  (Discussed expectations of SSRI medication including time to effectiveness and mechanism of action, also poss of side effects (early and late)- including mental fuzziness, weight or appetite change, nausea and poss of worse dep or anxiety (even suicidal thoughts)  Pt voiced understanding and will stop med and update if this occurs ) Also refer to counseling

## 2015-06-09 ENCOUNTER — Telehealth: Payer: Self-pay | Admitting: Family Medicine

## 2015-06-09 NOTE — Telephone Encounter (Signed)
Pt called to let you know that he has an appt at the [place] office next week.

## 2015-06-09 NOTE — Telephone Encounter (Signed)
Great, thanks for letting me know 

## 2015-06-15 ENCOUNTER — Ambulatory Visit: Payer: BLUE CROSS/BLUE SHIELD | Admitting: Psychology

## 2015-06-26 ENCOUNTER — Other Ambulatory Visit: Payer: Self-pay | Admitting: *Deleted

## 2015-06-26 MED ORDER — SUCRALFATE 1 G PO TABS: 1.0000 g | ORAL_TABLET | Freq: Three times a day (TID) | ORAL | Status: AC

## 2015-06-26 NOTE — Telephone Encounter (Signed)
Received fax requesting Rx to be changed to a 90 day supply, done 

## 2015-07-01 ENCOUNTER — Telehealth: Payer: Self-pay | Admitting: Gastroenterology

## 2015-07-01 ENCOUNTER — Ambulatory Visit: Payer: BLUE CROSS/BLUE SHIELD | Admitting: Psychology

## 2015-07-01 NOTE — Telephone Encounter (Signed)
Left message on machine to call back  

## 2015-07-03 NOTE — Telephone Encounter (Signed)
Left message on machine to call back  

## 2015-07-06 NOTE — Telephone Encounter (Signed)
Left message on machine to call back will wait for further communication from the pt  

## 2015-07-17 ENCOUNTER — Ambulatory Visit: Payer: Self-pay | Admitting: Psychology

## 2017-02-25 IMAGING — CR DG ABDOMEN 2V
3 series · 3 of 3 positions shown · non-contrast
Comparison: Abdominal pelvic CT 04/22/2014.

CLINICAL DATA: Abdominal pain for 4 months. Camera pill
administered last week. Acute flare of abdominal pain today. Initial
encounter.

EXAM:
ABDOMEN - 2 VIEW

[w abdomen upright]
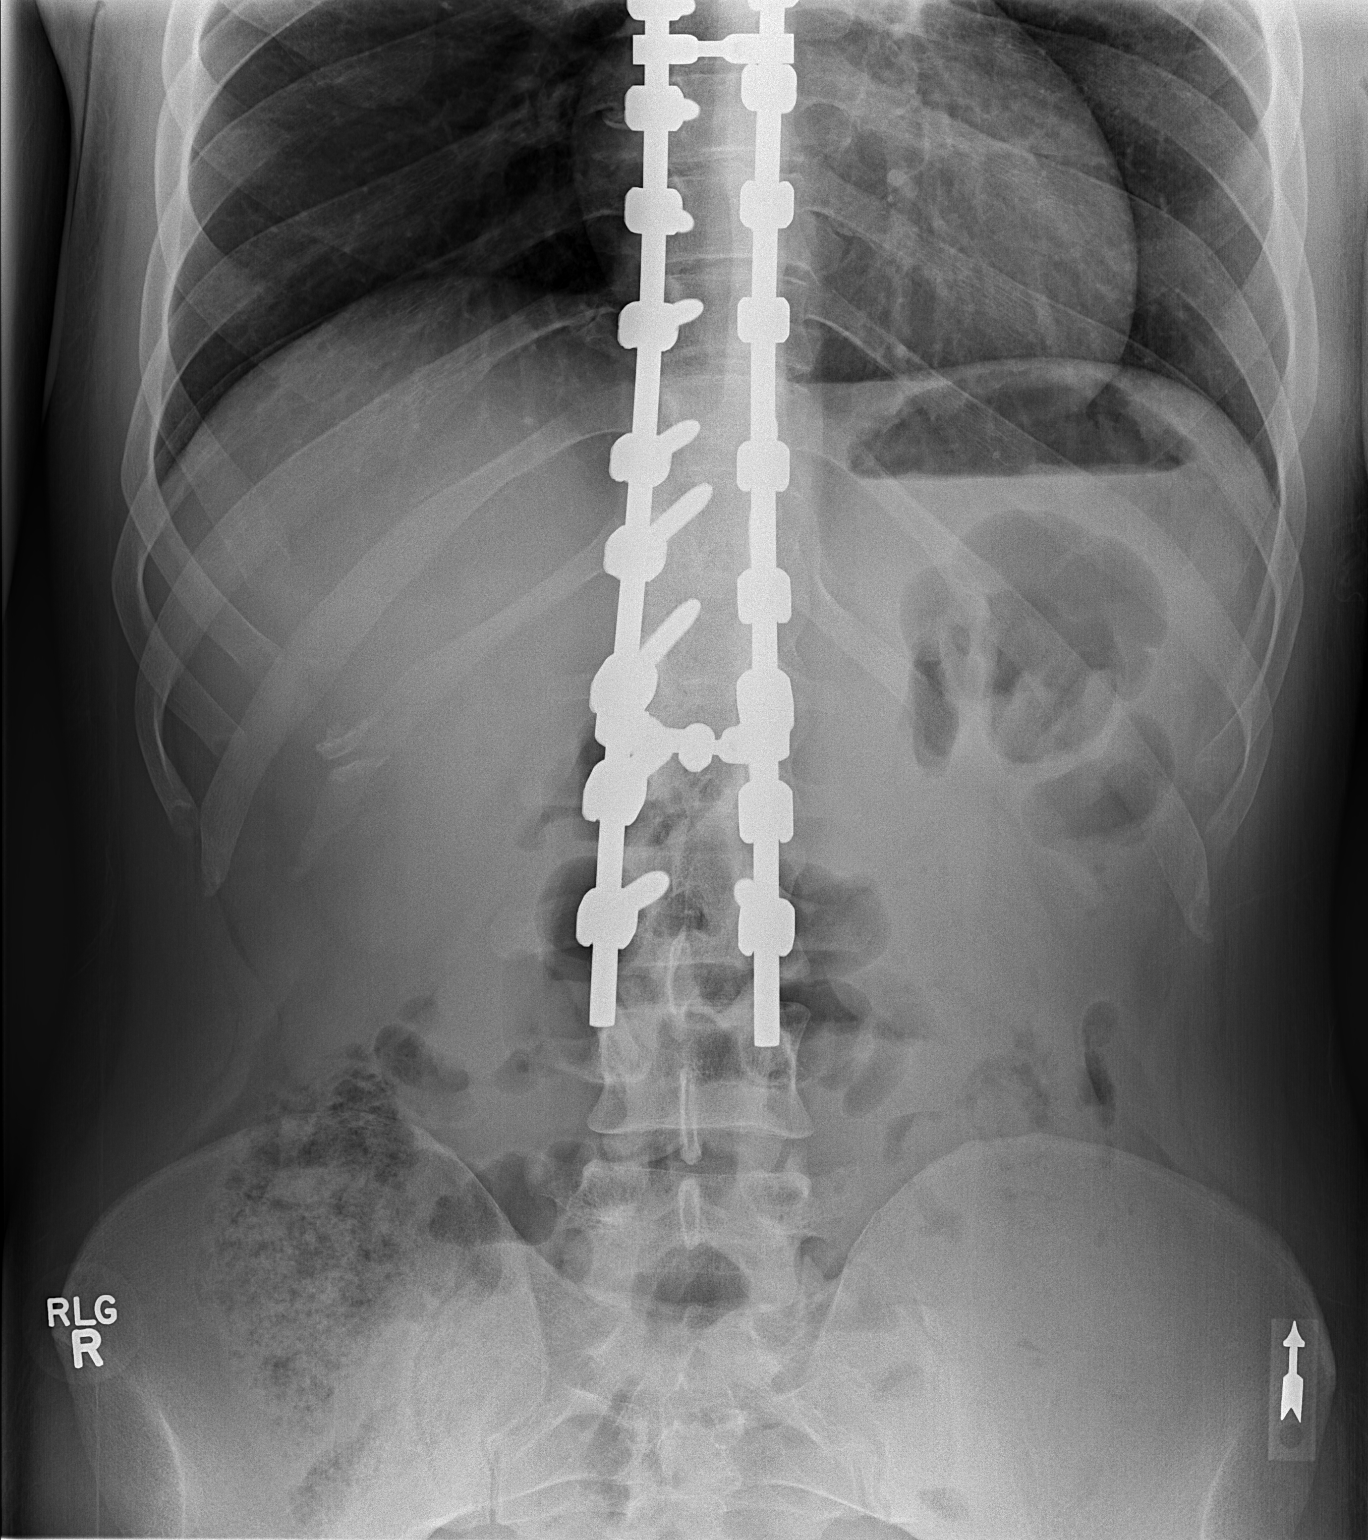

[t abdomen supine (1 of 2)]
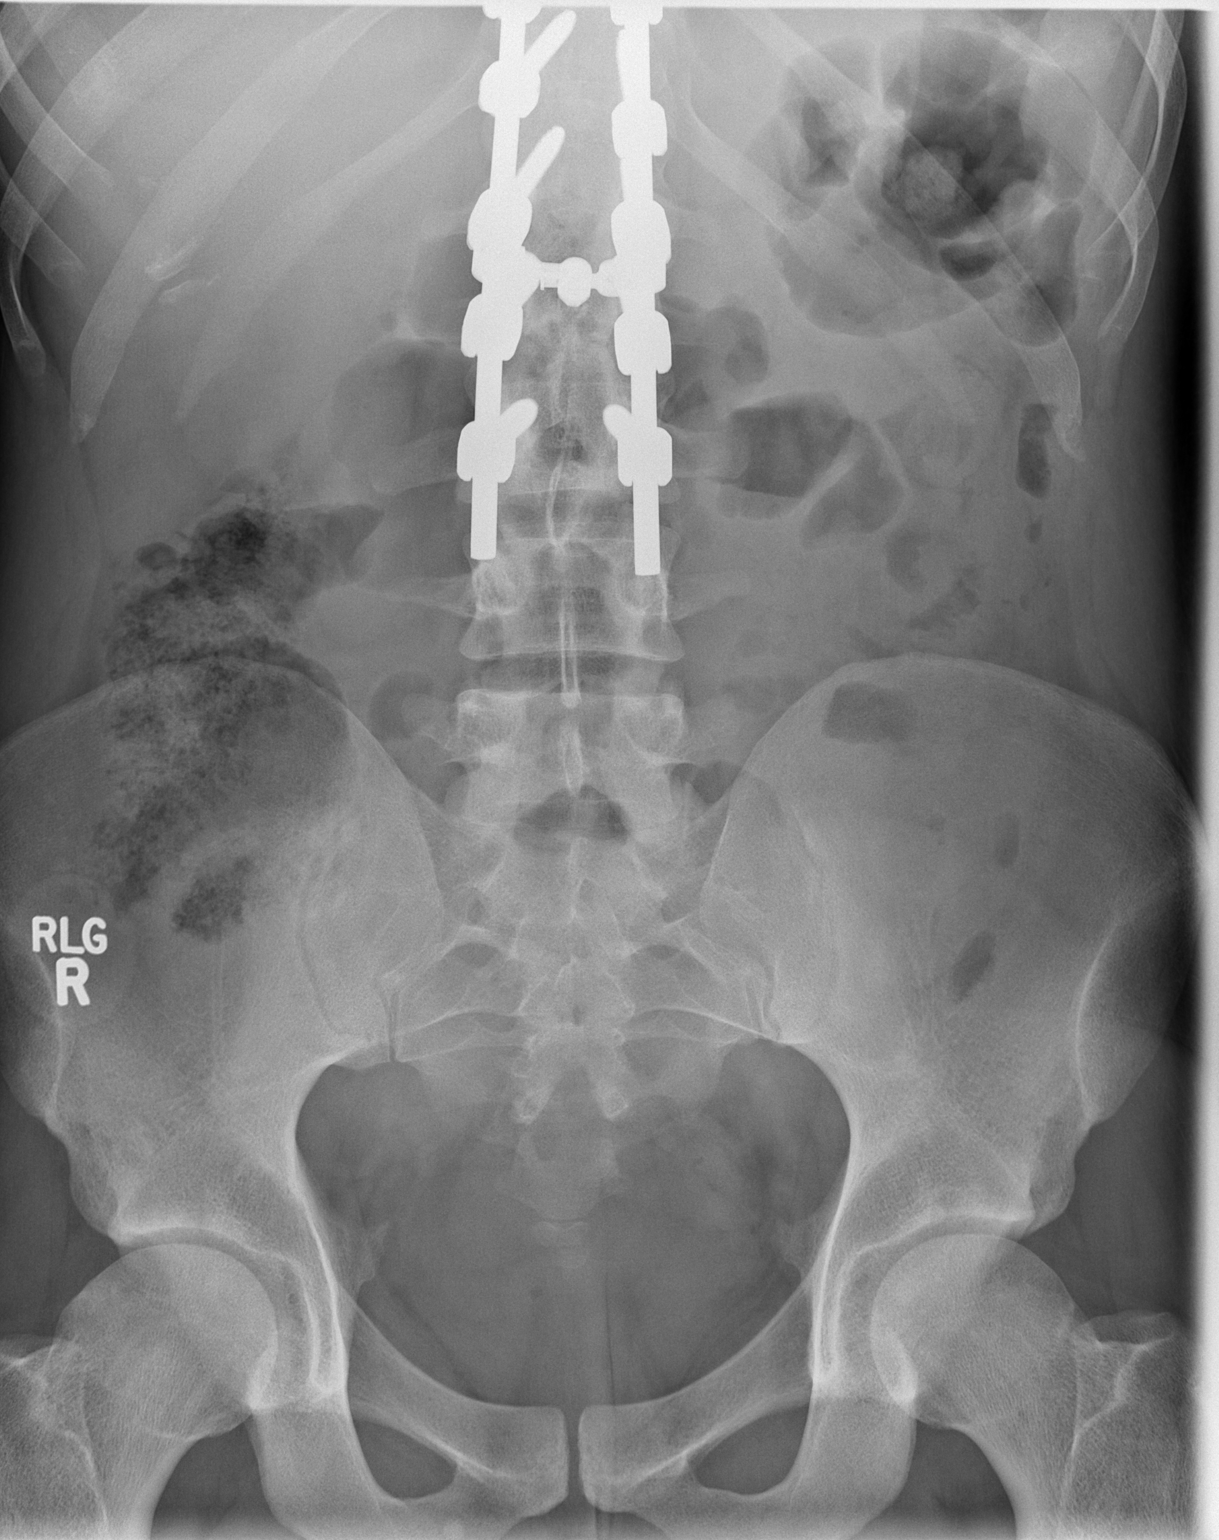

[t abdomen supine (2 of 2)]
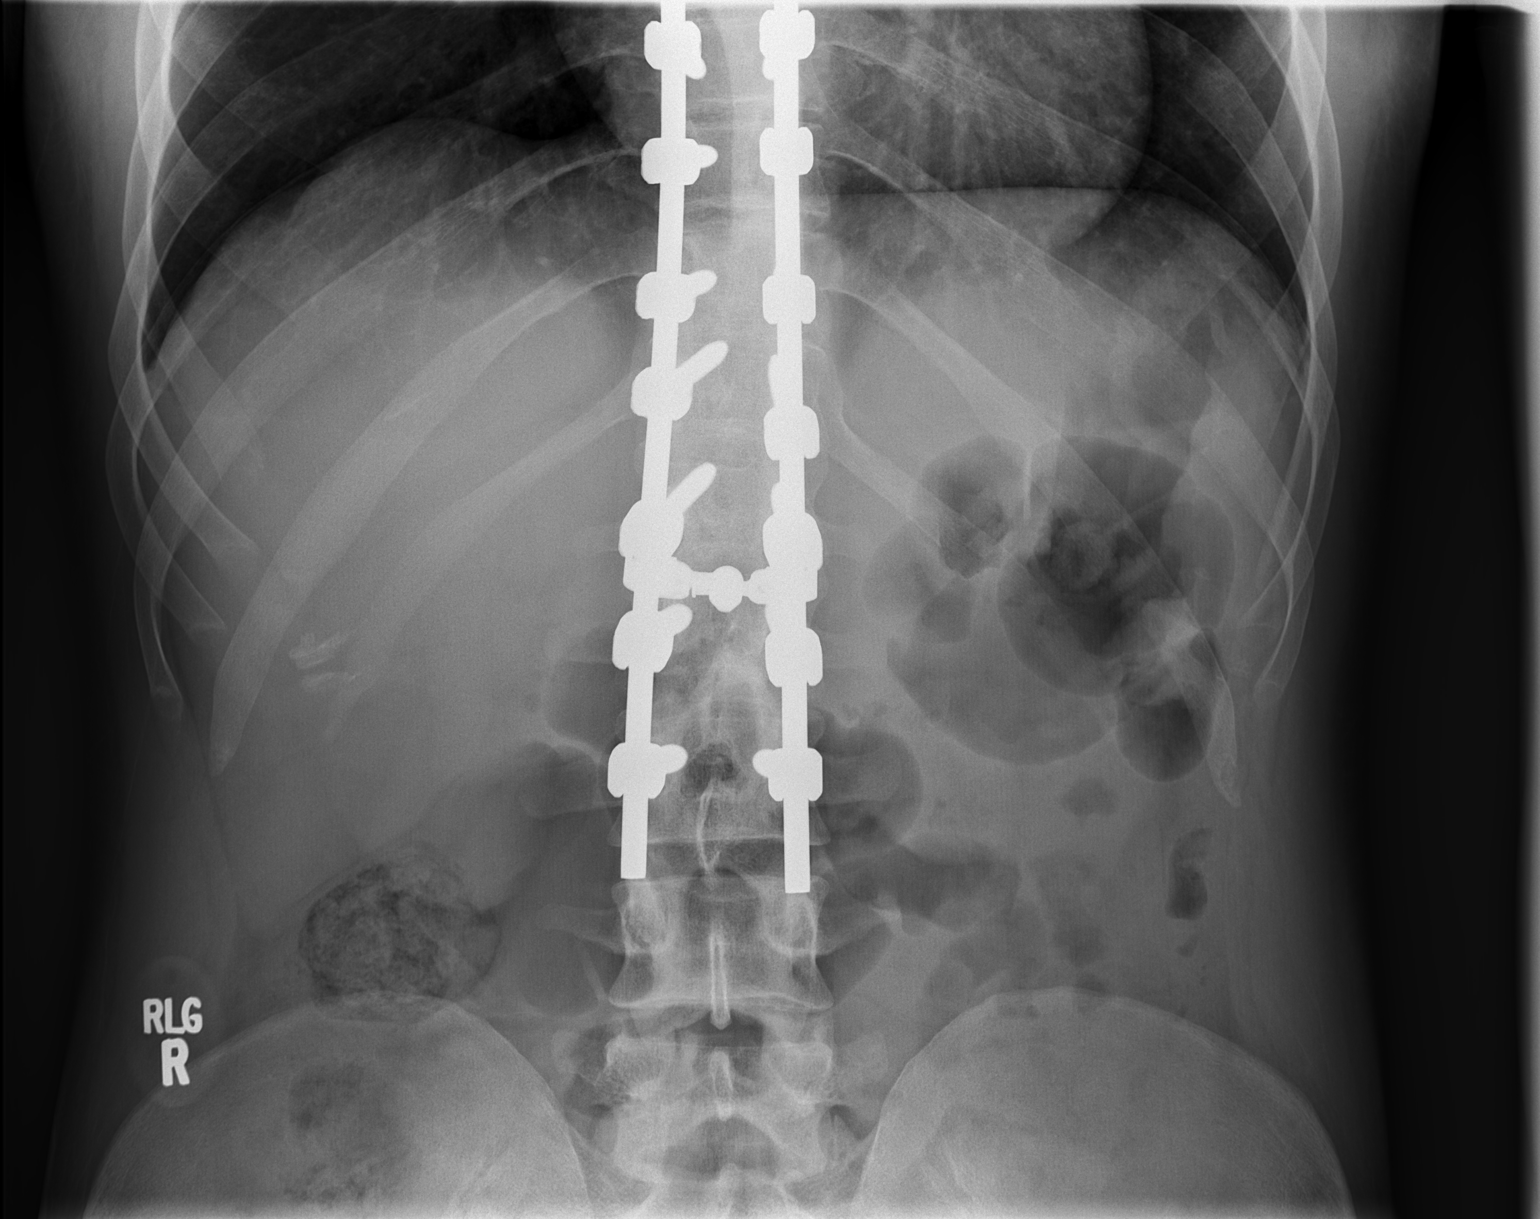

[3 of 3 positions shown; findings below may reference images not displayed]

FINDINGS: The bowel gas pattern is normal. There is no free intraperitoneal
air. No camera pill or other foreign body demonstrated. The bones
appear unchanged status post thoracolumbar rod fusion.
IMPRESSION: No acute abdominal findings.  No evidence of retained camera pill.

## 2017-04-08 ENCOUNTER — Emergency Department (HOSPITAL_COMMUNITY)
Admission: EM | Admit: 2017-04-08 | Discharge: 2017-04-09 | Disposition: A | Discharge status: Home / Self Care | Payer: 59 | Attending: Emergency Medicine | Admitting: Emergency Medicine

## 2017-04-08 DIAGNOSIS — T2010XA Burn of first degree of head, face, and neck, unspecified site, initial encounter: Secondary | ICD-10-CM | POA: Insufficient documentation

## 2017-04-08 DIAGNOSIS — Y92410 Unspecified street and highway as the place of occurrence of the external cause: Secondary | ICD-10-CM | POA: Diagnosis not present

## 2017-04-08 DIAGNOSIS — S50812A Abrasion of left forearm, initial encounter: Secondary | ICD-10-CM | POA: Diagnosis not present

## 2017-04-08 DIAGNOSIS — S29012A Strain of muscle and tendon of back wall of thorax, initial encounter: Secondary | ICD-10-CM | POA: Insufficient documentation

## 2017-04-08 DIAGNOSIS — Y9389 Activity, other specified: Secondary | ICD-10-CM | POA: Diagnosis not present

## 2017-04-08 DIAGNOSIS — Z79899 Other long term (current) drug therapy: Secondary | ICD-10-CM | POA: Diagnosis not present

## 2017-04-08 DIAGNOSIS — F419 Anxiety disorder, unspecified: Secondary | ICD-10-CM | POA: Insufficient documentation

## 2017-04-08 DIAGNOSIS — Y998 Other external cause status: Secondary | ICD-10-CM | POA: Diagnosis not present

## 2017-04-08 DIAGNOSIS — F17210 Nicotine dependence, cigarettes, uncomplicated: Secondary | ICD-10-CM | POA: Diagnosis not present

## 2017-04-08 DIAGNOSIS — J45909 Unspecified asthma, uncomplicated: Secondary | ICD-10-CM | POA: Diagnosis not present

## 2017-04-08 DIAGNOSIS — S161XXA Strain of muscle, fascia and tendon at neck level, initial encounter: Secondary | ICD-10-CM | POA: Insufficient documentation

## 2017-04-08 DIAGNOSIS — T2000XA Burn of unspecified degree of head, face, and neck, unspecified site, initial encounter: Secondary | ICD-10-CM | POA: Diagnosis present

## 2017-04-08 DIAGNOSIS — S40819A Abrasion of unspecified upper arm, initial encounter: Secondary | ICD-10-CM

## 2017-04-08 DIAGNOSIS — S50811A Abrasion of right forearm, initial encounter: Secondary | ICD-10-CM | POA: Diagnosis not present

## 2017-04-08 DIAGNOSIS — X088XXA Exposure to other specified smoke, fire and flames, initial encounter: Secondary | ICD-10-CM | POA: Diagnosis not present

## 2017-04-08 DIAGNOSIS — S29019A Strain of muscle and tendon of unspecified wall of thorax, initial encounter: Secondary | ICD-10-CM

## 2017-04-08 MED ORDER — MORPHINE SULFATE (PF) 4 MG/ML IV SOLN
4.0000 mg | Freq: Once | INTRAVENOUS | Status: AC
  Administered 2017-04-08: 4 mg via INTRAVENOUS
  Filled 2017-04-08: qty 1
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
## 2017-04-09 ENCOUNTER — Emergency Department (HOSPITAL_COMMUNITY): Payer: 59

## 2017-04-09 DIAGNOSIS — S29012A Strain of muscle and tendon of back wall of thorax, initial encounter: Secondary | ICD-10-CM | POA: Diagnosis not present

## 2017-04-09 MED ORDER — MORPHINE SULFATE (PF) 4 MG/ML IV SOLN
4.0000 mg | Freq: Once | INTRAVENOUS | Status: AC
  Administered 2017-04-09: 4 mg via INTRAVENOUS
  Filled 2017-04-09: qty 1

## 2017-04-09 MED ORDER — HYDROCODONE-ACETAMINOPHEN 5-325 MG PO TABS: 1.0000 | ORAL_TABLET | Freq: Four times a day (QID) | ORAL | 0 refills | Status: AC | PRN

## 2017-04-09 NOTE — Discharge Instructions (Signed)
Local wound care with bacitracin twice daily.  Ibuprofen 600 mg every 6 hours as needed for pain.  Hydrocodone is prescribed as needed for pain not relieved with ibuprofen.  Follow-up with your primary doctor in the next week if symptoms are not improving, and return to the ER if symptoms significantly worsen or change.
